# Patient Record
Sex: Female | Born: 2000 | Race: White | Hispanic: No | Marital: Single | Smoking: Never smoker
Health system: Southern US, Community
[De-identification: ages and names within clinical notes are randomized; demographics above are authoritative.]

## PROBLEM LIST (undated history)

## (undated) DIAGNOSIS — J310 Chronic rhinitis: Secondary | ICD-10-CM

## (undated) DIAGNOSIS — M549 Dorsalgia, unspecified: Secondary | ICD-10-CM

## (undated) DIAGNOSIS — E785 Hyperlipidemia, unspecified: Secondary | ICD-10-CM

## (undated) DIAGNOSIS — F32A Depression, unspecified: Secondary | ICD-10-CM

## (undated) DIAGNOSIS — N912 Amenorrhea, unspecified: Secondary | ICD-10-CM

## (undated) DIAGNOSIS — N809 Endometriosis, unspecified: Secondary | ICD-10-CM

## (undated) DIAGNOSIS — N946 Dysmenorrhea, unspecified: Secondary | ICD-10-CM

## (undated) DIAGNOSIS — Z872 Personal history of diseases of the skin and subcutaneous tissue: Secondary | ICD-10-CM

## (undated) HISTORY — DX: Amenorrhea, unspecified: N91.2

## (undated) HISTORY — DX: Personal history of diseases of the skin and subcutaneous tissue: Z87.2

## (undated) HISTORY — DX: Dorsalgia, unspecified: M54.9

## (undated) HISTORY — DX: Endometriosis, unspecified: N80.9

## (undated) HISTORY — PX: DENTAL SURGERY: SHX609

## (undated) HISTORY — DX: Hyperlipidemia, unspecified: E78.5

## (undated) HISTORY — DX: Chronic rhinitis: J31.0

## (undated) HISTORY — DX: Depression, unspecified: F32.A

## (undated) HISTORY — DX: Dysmenorrhea, unspecified: N94.6

---
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
## 2019-03-10 ENCOUNTER — Telehealth (INDEPENDENT_AMBULATORY_CARE_PROVIDER_SITE_OTHER): Payer: PRIVATE HEALTH INSURANCE | Admitting: Internal Medicine

## 2019-03-10 ENCOUNTER — Encounter: Payer: Self-pay | Admitting: Internal Medicine

## 2019-03-10 ENCOUNTER — Other Ambulatory Visit: Payer: Self-pay

## 2019-03-10 DIAGNOSIS — F321 Major depressive disorder, single episode, moderate: Secondary | ICD-10-CM

## 2019-03-10 MED ORDER — FLUOXETINE HCL 10 MG PO CAPS: 10.0000 mg | ORAL_CAPSULE | Freq: Every day | ORAL | 1 refills | Status: DC

## 2019-03-10 NOTE — Progress Notes (Signed)
Virtual Visit via Video Note  I connected with@ on 03/10/19 at  3:30 PM EST by a video enabled telemedicine application and verified that I am speaking with the correct person using two identifiers. Location patient: home Location provider:work  office Persons participating in the virtual visit: patient, provider  [place] national recommendations  regarding COVID 19 pandemic   video visit is advised over in office visit for this patient.  Patient aware  of the limitations of evaluation and management by telemedicine and  availability of in person appointments. and agreed to proceed.   HPI: Rose Stewart presents for video visit  In counseling since nov still struggling with depression and some anxiety    Looking to help with meds also  Student [employer] at home 18 hours [employer] psych and socw?  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  Past Medical History:  Diagnosis Date  . Back pain 2015   evaluated Dr Layne Benton RX: PT nsaids  . Hx of urticaria 2015   vs contact dermatitis   . Rhinitis 2004   evaluation allergy dr [name] felt non allergic    Past Surgical History:  Procedure Laterality Date  . DENTAL SURGERY      Family History  Problem Relation Age of Onset  . Endometriosis Mother   . Hypertension Mother   . Asthma Unknown   . Diabetes Mellitus II Unknown   . Alcohol abuse Unknown        sub  . Diabetes Maternal Grandfather     Social History   Tobacco Use  . Smoking status: Never Smoker  . Smokeless tobacco: Never Used  Substance Use Topics  . Alcohol use: No  . Drug use: No      Current Outpatient Medications:  .  cyclobenzaprine (FLEXERIL) 10 MG tablet, Take 1 tablet (10 mg total) by mouth 3 (three) times daily as needed for muscle spasms.,  Disp: 30 tablet, Rfl: 1 .  FLUoxetine (PROZAC) 10 MG capsule, Take 1 capsule (10 mg total) by mouth daily. Increase to 2 per day( 20 mg ) after 1 week, Disp: 60 capsule, Rfl: 1 .  ibuprofen (ADVIL,MOTRIN) 800 MG tablet, Take 1 tablet (800 mg total) by mouth every 8 (eight) hours as needed. For pain, Disp: 40 tablet, Rfl: 1 .  naproxen (NAPROSYN) 500 MG tablet, Take 1 tablet (500 mg total) by mouth 2 (two) times daily with a meal., Disp: 60 tablet, Rfl: 1 .  Norethindrone Acetate-Ethinyl Estrad-FE (LOESTRIN 24 FE) 1-20 MG-MCG(24) tablet, Take 1 tablet by mouth daily., Disp: 3 Package, Rfl: 4  EXAM: BP Readings from Last 3 Encounters:  03/17/18 116/62  01/27/18 (!) 142/90  08/14/17 112/70    VITALS per patient if applicable:  GENERAL: alert, oriented, appears well and in no acute distress  HEENT: atraumatic, conjunttiva clear, no obvious abnormalities on inspection of external nose and ears  NECK: normal movements of the head and neck  LUNGS: on inspection no signs of respiratory distress, breathing rate appears normal, no obvious gross SOB, gasping or wheezing  CV: no obvious cyanosis  MS: moves all visible extremities without noticeable abnormality  PSYCH/NEURO: pleasant and cooperative, nl  speech and  thought processing grossly intact phq 9  verydifficult    20  Lab Results  Component Value Date   WBC 10.9 10/20/2015   HGB 12.7 10/20/2015   HCT 37.6 10/20/2015   PLT 376 10/20/2015   GLUCOSE 97 10/20/2015   ALT 18 10/20/2015   AST 25 10/20/2015   NA 138 10/20/2015   K 3.7 10/20/2015   CL 106 10/20/2015   CREATININE 0.63 10/20/2015   BUN 12 10/20/2015   CO2 24 10/20/2015    ASSESSMENT AND PLAN:  Discussed the following assessment and plan:    ICD-10-CM   1. Depression, major, single episode, moderate (HCC)  F32.1    som anxiety  not predominant    With anxiety  functioning  No alarm sx  Predated covid 19 shut down  Some familu stresses  continue  Counseling and  add ssri  And fu  3 weeks    Begin fluoxetine 10 - 20  Counseled.   Expectant management and discussion of plan and treatment with opportunity to ask questions and all were answered. The patient agreed with the plan and demonstrated an understanding of the instructions.   Advised to call back or seek an in-person evaluation if worsening  or having  further concerns . In interm  Return in about 3 weeks (around 03/31/2019) for med check or as needed. I provided 35  minutes of non-face-to-face time during this encounter.   Berniece Andreas, MD

## 2019-04-01 NOTE — Progress Notes (Signed)
Virtual Visit via Video Note  I connected with@ on  3 18 21at 10:30 AM EDT by a video enabled telemedicine application and verified that I am speaking with the correct person using two identifiers. Location patient: home Location provider: home office Persons participating in the virtual visit: patient, provider  [place] national recommendations  regarding COVID 19 pandemic   video visit is advised over in office visit for this patient.  Patient aware  of the limitations of evaluation and management by telemedicine and  availability of in person appointments. and agreed to proceed.   HPI: Rose Stewart presents for video visit for fu of medication for depression anxiety sx   Began 10 mg for 2 weeks and then increase to 20 mg  For the past week  Has noted some improvement in not beeing down and mental energy not always difficult not as anxious  Sleep: still goes to be 10 - 11 but not falling asleep until about 2 am  Is drowsier in evening .  Seeing counselor monthly    ROS: See pertinent positives and negatives per HPI. No new sx reported   Past Medical History:  Diagnosis Date  . Back pain 2015   evaluated Dr Layne Benton RX: PT nsaids  . Hx of urticaria 2015   vs contact dermatitis   . Rhinitis 2004   evaluation allergy dr [name] felt non allergic    Past Surgical History:  Procedure Laterality Date  . DENTAL SURGERY      Family History  Problem Relation Age of Onset  . Endometriosis Mother   . Hypertension Mother   . Asthma Unknown   . Diabetes Mellitus II Unknown   . Alcohol abuse Unknown        sub  . Diabetes Maternal Grandfather     Social History   Tobacco Use  . Smoking status: Never Smoker  . Smokeless tobacco: Never Used  Substance Use Topics  . Alcohol use: No  . Drug use: No      Current Outpatient Medications:  .  FLUoxetine (PROZAC) 10 MG capsule, Take 1 capsule (10 mg total) by mouth daily. Increase to 2 per day( 20 mg ) after 1 week, Disp: 60  capsule, Rfl: 1 .  Norethindrone Acetate-Ethinyl Estrad-FE (LOESTRIN 24 FE) 1-20 MG-MCG(24) tablet, Take 1 tablet by mouth daily., Disp: 3 Package, Rfl: 4  EXAM: BP Readings from Last 3 Encounters:  03/17/18 116/62  01/27/18 (!) 142/90  08/14/17 112/70    VITALS per patient if applicable:  GENERAL: alert, oriented, appears well and in no acute distress  HEENT: atraumatic, conjunttiva clear, no obvious abnormalities on inspection of external nose and ears  NECK: normal movements of the head and neck  LUNGS: on inspection no signs of respiratory distress, breathing rate appears normal, no obvious gross SOB, gasping or wheezing mality  PSYCH/NEURO: pleasant and cooperative, affect a bit brighter  Articulate , speech and thought processing grossly intact   ASSESSMENT AND PLAN:  Discussed the following assessment and plan:    ICD-10-CM   1. Depression, major, single episode, moderate (HCC)  F32.1    improved on 20 gm still has sleep issues  2. Medication management  Z79.899   3. Sleep disturbance  G47.9     Counseled.  About sleep  Give time and sleep hygiene  Can try  fluox 10 bid  To see if makes a difference  But need more time on the 20 mg dosing.   Plan VV rov  in about 6 weeks  Have pharmacy contact us for refills  Continue monthly counseling  Or as indicated   Expectant management and discussion of plan and treatment with opportunity to ask questions and all were answered. The patient agreed with the plan and demonstrated an understanding of the instructions.   Advised to call back or seek an in-person evaluation if worsening  or having  further concerns . In interim  Return in about 6 weeks (around 05/14/2019) for medication virtual ok .   Berniece Andreas, MD

## 2019-04-02 ENCOUNTER — Encounter: Payer: Self-pay | Admitting: Internal Medicine

## 2019-04-02 ENCOUNTER — Other Ambulatory Visit: Payer: Self-pay | Admitting: Internal Medicine

## 2019-04-02 ENCOUNTER — Other Ambulatory Visit: Payer: Self-pay

## 2019-04-02 ENCOUNTER — Telehealth (INDEPENDENT_AMBULATORY_CARE_PROVIDER_SITE_OTHER): Payer: PRIVATE HEALTH INSURANCE | Admitting: Internal Medicine

## 2019-04-02 VITALS — Ht 63.25 in | Wt 150.0 lb

## 2019-04-02 DIAGNOSIS — F321 Major depressive disorder, single episode, moderate: Secondary | ICD-10-CM

## 2019-04-02 DIAGNOSIS — G479 Sleep disorder, unspecified: Secondary | ICD-10-CM | POA: Diagnosis not present

## 2019-04-02 DIAGNOSIS — Z79899 Other long term (current) drug therapy: Secondary | ICD-10-CM | POA: Diagnosis not present

## 2019-04-02 NOTE — Telephone Encounter (Signed)
Okay to refill for 90-day supply. 

## 2019-04-03 NOTE — Telephone Encounter (Signed)
Yes  Can but change to  10 mg  2 x per day for 90 days  No refills

## 2019-04-28 ENCOUNTER — Emergency Department (HOSPITAL_BASED_OUTPATIENT_CLINIC_OR_DEPARTMENT_OTHER)
Admission: EM | Admit: 2019-04-28 | Discharge: 2019-04-28 | Disposition: A | Discharge status: Home / Self Care | Payer: Commercial Managed Care - PPO | Attending: Emergency Medicine | Admitting: Emergency Medicine

## 2019-04-28 ENCOUNTER — Emergency Department (HOSPITAL_BASED_OUTPATIENT_CLINIC_OR_DEPARTMENT_OTHER): Payer: Commercial Managed Care - PPO

## 2019-04-28 ENCOUNTER — Other Ambulatory Visit: Payer: Self-pay

## 2019-04-28 ENCOUNTER — Encounter (HOSPITAL_BASED_OUTPATIENT_CLINIC_OR_DEPARTMENT_OTHER): Payer: Self-pay

## 2019-04-28 DIAGNOSIS — Y9351 Activity, roller skating (inline) and skateboarding: Secondary | ICD-10-CM | POA: Insufficient documentation

## 2019-04-28 DIAGNOSIS — Z793 Long term (current) use of hormonal contraceptives: Secondary | ICD-10-CM | POA: Insufficient documentation

## 2019-04-28 DIAGNOSIS — S52124A Nondisplaced fracture of head of right radius, initial encounter for closed fracture: Secondary | ICD-10-CM | POA: Insufficient documentation

## 2019-04-28 DIAGNOSIS — S93491A Sprain of other ligament of right ankle, initial encounter: Secondary | ICD-10-CM | POA: Insufficient documentation

## 2019-04-28 DIAGNOSIS — Y999 Unspecified external cause status: Secondary | ICD-10-CM | POA: Insufficient documentation

## 2019-04-28 DIAGNOSIS — Z79899 Other long term (current) drug therapy: Secondary | ICD-10-CM | POA: Insufficient documentation

## 2019-04-28 DIAGNOSIS — Y929 Unspecified place or not applicable: Secondary | ICD-10-CM | POA: Insufficient documentation

## 2019-04-28 DIAGNOSIS — S6991XA Unspecified injury of right wrist, hand and finger(s), initial encounter: Secondary | ICD-10-CM | POA: Diagnosis present

## 2019-04-28 MED ORDER — IBUPROFEN 800 MG PO TABS
800.0000 mg | ORAL_TABLET | Freq: Once | ORAL | Status: AC
  Administered 2019-04-28: 23:00:00 800 mg via ORAL
  Filled 2019-04-28: qty 1

## 2019-04-28 MED ORDER — HYDROCODONE-ACETAMINOPHEN 5-325 MG PO TABS
1.0000 | ORAL_TABLET | Freq: Once | ORAL | Status: AC
Start: 2019-04-28 — End: 2019-04-28
  Administered 2019-04-28: 23:00:00 1 via ORAL
  Filled 2019-04-28: qty 1

## 2019-04-28 MED ORDER — IBUPROFEN 800 MG PO TABS: 800.0000 mg | ORAL_TABLET | Freq: Three times a day (TID) | ORAL | 0 refills | Status: DC

## 2019-04-28 MED ORDER — ACETAMINOPHEN 500 MG PO TABS: 500.0000 mg | ORAL_TABLET | Freq: Four times a day (QID) | ORAL | 0 refills | Status: DC | PRN

## 2019-04-28 NOTE — ED Triage Notes (Addendum)
Pt states she fell off skate board ~45 min PTA-pain to right wrist, right elbow and right ankle-to triage in w/c-NAD

## 2019-04-28 NOTE — ED Provider Notes (Signed)
[facility] Provider Note   CSN: [id number] Arrival date & time: 04/28/19  1902     History Chief Complaint  Patient presents with  . Fall    Rose Stewart is a 19 y.o. female with no relevant past medical history presents the ED after sustaining mechanical fall while on a skateboard.  Patient reports that she was standing on it when it slipped out from underneath her and she landed on her right side.  She complains of 5 out of 10 right ankle pain, 7 out of 10 right wrist pain, and 9 out of 10 right elbow pain.  She has not been able to bear weight since sustaining her mechanical fall.  She is able to move her shoulder and can range her wrist, but is unable to flex or extend her elbow.  She is holding at her side at approximately 120 degrees.  She denies any significant swelling, numbness, weakness, tingling, clavicular discomfort, head injury, loss consciousness, bleeding disorder, open wound, or other symptoms.  HPI     Past Medical History:  Diagnosis Date  . Back pain 2015   evaluated Dr Layne Benton RX: PT nsaids  . Hx of urticaria 2015   vs contact dermatitis   . Rhinitis 2004   evaluation allergy dr [name] felt non allergic    Patient Active Problem List   Diagnosis Date Noted  . Recurrent headache 03/17/2018  . Family history of migraine headaches in mother 03/17/2018  . Oral contraceptive use 03/17/2018    Past Surgical History:  Procedure Laterality Date  . DENTAL SURGERY       OB History    Gravida  0   Para  0   Term  0   Preterm  0   AB  0   Living  0     SAB  0   TAB  0   Ectopic  0   Multiple  0   Live Births  0           Family History  Problem Relation Age of Onset  . Endometriosis Mother   . Hypertension Mother   . Asthma Other   . Diabetes Mellitus II Other   . Alcohol abuse Other        sub  . Diabetes Maternal Grandfather     Social History   Tobacco Use  . Smoking status: Never  Smoker  . Smokeless tobacco: Never Used  Substance Use Topics  . Alcohol use: No  . Drug use: No    Home Medications Prior to Admission medications   Medication Sig Start Date End Date Taking? Authorizing Provider  acetaminophen (TYLENOL) 500 MG tablet Take 1 tablet (500 mg total) by mouth every 6 (six) hours as needed. 04/28/19   Corena Herter, PA-C  FLUoxetine (PROZAC) 10 MG capsule Take 1 capsule (10 mg total) by mouth daily. BID 04/03/19   Panosh, Standley Brooking, MD  ibuprofen (ADVIL) 800 MG tablet Take 1 tablet (800 mg total) by mouth 3 (three) times daily. 04/28/19   Corena Herter, PA-C  Norethindrone Acetate-Ethinyl Estrad-FE (LOESTRIN 24 FE) 1-20 MG-MCG(24) tablet Take 1 tablet by mouth daily. 01/27/18   Princess Bruins, MD    Allergies    Patient has no known allergies.  Review of Systems   Review of Systems  Musculoskeletal: Positive for arthralgias and joint swelling.  Skin: Negative for wound.  Neurological: Negative for weakness and numbness.    Physical Exam Updated  Vital Signs BP 108/70 (BP Location: Right Arm)   Pulse 83   Temp 97.8 F (36.6 C) (Oral)   Resp 16   Ht 5\' 3"  (1.6 m)   Wt 68 kg   SpO2 98%   BMI 26.57 kg/m   Physical Exam Vitals and nursing note reviewed. Exam conducted with a chaperone present.  Constitutional:      General: She is not in acute distress.    Appearance: Normal appearance. She is not ill-appearing.  HENT:     Head: Normocephalic and atraumatic.  Eyes:     General: No scleral icterus.    Conjunctiva/sclera: Conjunctivae normal.  Cardiovascular:     Rate and Rhythm: Normal rate and regular rhythm.     Pulses: Normal pulses.     Heart sounds: Normal heart sounds.  Pulmonary:     Effort: Pulmonary effort is normal. No respiratory distress.     Breath sounds: Normal breath sounds.  Musculoskeletal:     Comments: Right shoulder: No tenderness to palpation.  No clavicular tenderness.  No overlying skin changes.  No swelling.   Can abduct shoulder against resistance.  ROM intact.  No humeral tenderness to palpation. Right elbow: Significant pain with any flexion or extension.  Tenderness over radial head.  She can flex and extend elbow to 90 degrees flexion and 150 degree extension, but limited due to discomfort.  No overlying skin changes.  No significant swelling on exam.  Soft compartments. Right wrist: No significant distal radial or distal ulnar TTP.  No scaphoid tenderness.  Radial pulse and cap refill intact.  Assessed radial, median, and ulnar nerve which are intact.  Can wiggle fingers.  Grip strength intact.  Can flex and extend wrist against resistance.  Skin:    General: Skin is dry.     Capillary Refill: Capillary refill takes less than 2 seconds.  Neurological:     Mental Status: She is alert.     GCS: GCS eye subscore is 4. GCS verbal subscore is 5. GCS motor subscore is 6.  Psychiatric:        Mood and Affect: Mood normal.        Behavior: Behavior normal.        Thought Content: Thought content normal.     ED Results / Procedures / Treatments   Labs (all labs ordered are listed, but only abnormal results are displayed) Labs Reviewed - No data to display  EKG None  Radiology DG Elbow Complete Right  Result Date: 04/28/2019 CLINICAL DATA:  Skateboard injury 45 minutes ago, right elbow pain EXAM: RIGHT ELBOW - COMPLETE 3+ VIEW COMPARISON:  None. FINDINGS: Frontal, bilateral oblique, and lateral views of the right elbow are obtained. There is a subtle cortical discontinuity along the lateral margin of the radial head on the external rotation view, consistent with nondisplaced fracture. No other acute bony abnormalities. Joint spaces are well preserved. Prominent elevation of the anterior fat pad on lateral view, without visualization of the posterior fat pad, equivocal for effusion. IMPRESSION: 1. Suspected nondisplaced fracture through the lateral margin of the radial head, with equivocal evidence  for associated joint effusion. Electronically Signed   By: 04/30/2019 M.D.   On: 04/28/2019 19:57   DG Wrist Complete Right  Result Date: 04/28/2019 CLINICAL DATA:  Fall today.  Wrist pain EXAM: RIGHT WRIST - COMPLETE 3+ VIEW COMPARISON:  None. FINDINGS: There is no evidence of fracture or dislocation. There is no evidence of arthropathy or other focal  bone abnormality. Soft tissues are unremarkable. IMPRESSION: Negative. Electronically Signed   By: Marlan Palau M.D.   On: 04/28/2019 19:56   DG Ankle Complete Right  Result Date: 04/28/2019 CLINICAL DATA:  Fall today. EXAM: RIGHT ANKLE - COMPLETE 3+ VIEW COMPARISON:  03/31/2018 FINDINGS: There is no evidence of fracture, dislocation, or joint effusion. There is no evidence of arthropathy or other focal bone abnormality. Soft tissues are unremarkable. IMPRESSION: Negative. Electronically Signed   By: Marlan Palau M.D.   On: 04/28/2019 19:58    Procedures Procedures (including critical care time)  Medications Ordered in ED Medications  HYDROcodone-acetaminophen (NORCO/VICODIN) 5-325 MG per tablet 1 tablet (has no administration in time range)  ibuprofen (ADVIL) tablet 800 mg (800 mg Oral Given 04/28/19 2245)    ED Course  I have reviewed the triage vital signs and the nursing notes.  Pertinent labs & imaging results that were available during my care of the patient were reviewed by me and considered in my medical decision making (see chart for details).    MDM Rules/Calculators/A&P                      I personally reviewed patient's imaging which demonstrates a nondisplaced fracture through the lateral margin of radial head, but no other acute osseous abnormalities.  This is consistent with her physical exam.  Patient reports that she has been evaluated at [facility] orthopedics in [place], [place] and has a good relationship with that practice.  Given her multiple complaints, feel as though orthopedic follow-up would be most  reasonable.  Will place in ASO brace for her right ankle swelling and discomfort consistent with knee sprain.  Will place in long-arm splint with posterior slab for her suspected radial head fracture.  Recommend that patient obtain a medical scooter from [employer] or similar distributor given her likely difficulty with crutches or cane in setting of her right ankle sprain.  Cautioned her about possibility of occult fractures that can be missed with initial imaging.    SPLINT APPLICATION Date/Time: 11:22 PM Authorized by: Evelena Leyden Consent: Verbal consent obtained. Risks and benefits: risks, benefits and alternatives were discussed Consent given by: patient Splint applied by: orthopedic technician Location details: Right arm Splint type: Long arm with posterior slab Supplies used: No plaster Post-procedure: The splinted body part was neurovascularly unchanged following the procedure. Patient tolerance: Patient tolerated the procedure well with no immediate complications.  Patient to be given ibuprofen and Tylenol for her symptoms of discomfort.  Cautioned on bleeding risk given her SSRI use.  Strict ED return precautions discussed.  All of the evaluation and work-up results were discussed with the patient and any family at bedside. They were provided opportunity to ask any additional questions and have none at this time. They have expressed understanding of verbal discharge instructions as well as return precautions and are agreeable to the plan.   Final Clinical Impression(s) / ED Diagnoses Final diagnoses:  Closed nondisplaced fracture of head of right radius, initial encounter  Sprain of anterior talofibular ligament of right ankle, initial encounter    Rx / DC Orders ED Discharge Orders         Ordered    ibuprofen (ADVIL) 800 MG tablet  3 times daily     04/28/19 2317    acetaminophen (TYLENOL) 500 MG tablet  Every 6 hours PRN     04/28/19 2317           Lorelee New,  PA-C 04/28/19 2323    Milagros Loll, MD 04/29/19 [phone]

## 2019-04-28 NOTE — Discharge Instructions (Addendum)
Please read the attachment on RICE therapy.  It is important that you obtain a scooter over-the-counter at a pharmacy or [employer] given your inability to use crutches.    Please take the Tylenol and ibuprofen, as prescribed.  Discontinue the ibuprofen immediately should you experience any epigastric pain or dark black stools given increased risk of bleeding while on Prozac.  Please follow-up with your primary care provider to notify them of today's encounter.  I would also like for you to schedule an appointment for ongoing evaluation and management with your orthopedist at [facility].    You were given narcotic and or sedative medications while in the emergency department. Do not drive. Do not use machinery or power tools. Do not sign legal documents. Do not drink alcohol. Do not take sleeping pills. Do not supervise children by yourself. Do not participate in activities that require climbing or being in high places.

## 2019-05-12 ENCOUNTER — Other Ambulatory Visit: Payer: Self-pay

## 2019-05-12 NOTE — Progress Notes (Signed)
This visit occurred during the SARS-CoV-2 public health emergency.  Safety protocols were in place, including screening questions prior to the visit, additional usage of staff PPE, and extensive cleaning of exam room while observing appropriate contact time as indicated for disinfecting solutions.    Chief Complaint  Patient presents with  . Menstrual Problem    Patient states she has not had a cycle in the past 2 months, she stopped her BC pills in October 2020    HPI: Rose Stewart 19 y.o. come in for   Period  Lack after stopping ocps   Had fx  r radius 4 13   Skateboard  Seeing Dr Rose Stewart   Seen for dpepressive sx  In march feb  Taking 20 fluoxetine  Doing better  Not as down all t he time  But still has decrease  Motivation and sleep an issues still not as sad   Not as bad  Went Off OCTS  since  Oct 20   Periods have been 5-6  Days monthly   Until last 2 mos  lmp Feb 27 - March 18 2019 and was normal   Menarche  6th grade .   Gets cramps with menses  Hx of depo shots   2019  X 2 years .  April  Second.had covid vaccine  9th  Moderna.   Family stresses   ROS: See pertinent positives and negatives per HPI. No vision change unusual ha breast dc  bno sa and denies a any risk of pregnancy   Past Medical History:  Diagnosis Date  . Back pain 2015   evaluated Dr Rose Stewart RX: PT nsaids  . Hx of urticaria 2015   vs contact dermatitis   . Rhinitis 2004   evaluation allergy dr [name] felt non allergic    Family History  Problem Relation Age of Onset  . Endometriosis Mother   . Hypertension Mother   . Asthma Other   . Diabetes Mellitus II Other   . Alcohol abuse Other        sub  . Diabetes Maternal Grandfather     Social History   Socioeconomic History  . Marital status: Single    Spouse name: Not on file  . Number of children: Not on file  . Years of education: Not on file  . Highest education level: Not on file  Occupational History  . Not on file  Tobacco  Use  . Smoking status: Never Smoker  . Smokeless tobacco: Never Used  Substance and Sexual Activity  . Alcohol use: No  . Drug use: No  . Sexual activity: Never    Birth control/protection: None  Other Topics Concern  . Not on file  Social History Narrative   HH  Of    Dog  Cats    9th grade  [employer].   Parents Rose Stewart and Rose Stewart good health self empoyed and Journalist, newspaper   Fa safety       Social Determinants of Health   Financial Resource Strain:   . Difficulty of Paying Living Expenses:   Food Insecurity:   . Worried About Charity fundraiser in the Last Year:   . Arboriculturist in the Last Year:   Transportation Needs:   . Film/video editor (Medical):   Marland Kitchen Lack of Transportation (Non-Medical):   Physical Activity:   . Days of Exercise per Week:   . Minutes of Exercise per Session:  Stress:   . Feeling of Stress :   Social Connections:   . Frequency of Communication with Friends and Family:   . Frequency of Social Gatherings with Friends and Family:   . Attends Religious Services:   . Active Member of Clubs or Organizations:   . Attends Banker Meetings:   Marland Kitchen Marital Status:     Outpatient Medications Prior to Visit  Medication Sig Dispense Refill  . acetaminophen (TYLENOL) 500 MG tablet Take 1 tablet (500 mg total) by mouth every 6 (six) hours as needed. 30 tablet 0  . FLUoxetine (PROZAC) 10 MG capsule Take 1 capsule (10 mg total) by mouth daily. BID 180 capsule 0  . ibuprofen (ADVIL) 800 MG tablet Take 1 tablet (800 mg total) by mouth 3 (three) times daily. 21 tablet 0  . ibuprofen (ADVIL) 400 MG tablet Take 400 mg by mouth 3 (three) times daily.    . Norethindrone Acetate-Ethinyl Estrad-FE (LOESTRIN 24 FE) 1-20 MG-MCG(24) tablet Take 1 tablet by mouth daily. (Patient not taking: Reported on 05/13/2019) 3 Package 4  . traMADol (ULTRAM) 50 MG tablet Take 50 mg by mouth 2 (two) times daily as needed.     No  facility-administered medications prior to visit.     EXAM:  BP 120/78   Pulse 84   Temp 97.6 F (36.4 C) (Temporal)   Ht 5\' 3"  (1.6 m)   Wt 150 lb 9.6 oz (68.3 kg)   SpO2 98%   BMI 26.68 kg/m   Body mass index is 26.68 kg/m.  GENERAL: vitals reviewed and listed above, alert, oriented, appears well hydrated and in no acute distress HEENT: atraumatic, conjunctiva  clear, no obvious abnormalities on inspection of external nose and ears OP : masked  NECK: no obvious masses on inspection palpation  LUNGS: clear to auscultation bilaterally, no wheezes, rales or rhonchi, good air movement CV: HRRR, no clubbing cyanosis or  peripheral edema nl cap refill  Abdomen:  Sof,t normal bowel sounds without hepatosplenomegaly, no guarding rebound or masses no CVA tenderness MS: r arm is splint left r ankle support  PSYCH: pleasant and cooperative, Lab Results  Component Value Date   WBC 10.9 10/20/2015   HGB 12.7 10/20/2015   HCT 37.6 10/20/2015   PLT 376 10/20/2015   GLUCOSE 97 10/20/2015   ALT 18 10/20/2015   AST 25 10/20/2015   NA 138 10/20/2015   K 3.7 10/20/2015   CL 106 10/20/2015   CREATININE 0.63 10/20/2015   BUN 12 10/20/2015   CO2 24 10/20/2015   BP Readings from Last 3 Encounters:  05/13/19 120/78  04/28/19 126/86  03/17/18 116/62  ate lunch   ASSESSMENT AND PLAN:  Discussed the following assessment and plan:  Lack of menses - could just be anovulatory  pause  nl exam lab today calendar and fu gyne check if needed - Plan: TSH, T4, free, Prolactin, HCG, Qualitative, CBC with Differential/Platelet, Basic metabolic panel, Hepatic function panel, Lipid panel  Screening, lipid - Plan: Lipid panel  Medication management  Depression, major, single episode, moderate (HCC) - improved follow and plan  touch base  in about a month and decide if visit needed at that time stay on same med  -Patient advised to return or notify health care team  if  new concerns  arise.  Patient Instructions  This could be a temporary problem  Blood work today   If no period in another month or so we  May get gyne  Involved .   checking cholesterol panel today because never done .   Calendar bleeding and let me know in a nother month .    Rose Stewart. Zuleica Seith M.D.

## 2019-05-13 ENCOUNTER — Encounter: Payer: Self-pay | Admitting: Internal Medicine

## 2019-05-13 ENCOUNTER — Ambulatory Visit (INDEPENDENT_AMBULATORY_CARE_PROVIDER_SITE_OTHER): Payer: Commercial Managed Care - PPO | Admitting: Internal Medicine

## 2019-05-13 ENCOUNTER — Other Ambulatory Visit: Payer: Self-pay

## 2019-05-13 VITALS — BP 120/78 | HR 84 | Temp 97.6°F | Ht 63.0 in | Wt 150.6 lb

## 2019-05-13 DIAGNOSIS — N912 Amenorrhea, unspecified: Secondary | ICD-10-CM

## 2019-05-13 DIAGNOSIS — Z13220 Encounter for screening for lipoid disorders: Secondary | ICD-10-CM | POA: Diagnosis not present

## 2019-05-13 DIAGNOSIS — Z79899 Other long term (current) drug therapy: Secondary | ICD-10-CM

## 2019-05-13 DIAGNOSIS — F321 Major depressive disorder, single episode, moderate: Secondary | ICD-10-CM | POA: Diagnosis not present

## 2019-05-13 LAB — PROLACTIN: Prolactin: 5.7 ng/mL

## 2019-05-13 LAB — HCG, SERUM, QUALITATIVE: Preg, Serum: NEGATIVE

## 2019-05-13 NOTE — Patient Instructions (Signed)
This could be a temporary problem  Blood work today   If no period in another month or so we  May get gyne   Involved .   checking cholesterol panel today because never done .   Calendar bleeding and let me know in a nother month .

## 2019-05-14 LAB — HEPATIC FUNCTION PANEL
ALT: 20 U/L (ref 0–35)
AST: 16 U/L (ref 0–37)
Albumin: 4.8 g/dL (ref 3.5–5.2)
Alkaline Phosphatase: 66 U/L (ref 39–117)
Bilirubin, Direct: 0.1 mg/dL (ref 0.0–0.3)
Total Bilirubin: 0.5 mg/dL (ref 0.2–1.2)
Total Protein: 7.4 g/dL (ref 6.0–8.3)

## 2019-05-14 LAB — CBC WITH DIFFERENTIAL/PLATELET
Basophils Absolute: 0 10*3/uL (ref 0.0–0.1)
Basophils Relative: 0.7 % (ref 0.0–3.0)
Eosinophils Absolute: 0.1 10*3/uL (ref 0.0–0.7)
Eosinophils Relative: 1.2 % (ref 0.0–5.0)
HCT: 40 % (ref 36.0–46.0)
Hemoglobin: 13.4 g/dL (ref 12.0–15.0)
Lymphocytes Relative: 36.7 % (ref 12.0–46.0)
Lymphs Abs: 2.4 10*3/uL (ref 0.7–4.0)
MCHC: 33.4 g/dL (ref 30.0–36.0)
MCV: 89.6 fl (ref 78.0–100.0)
Monocytes Absolute: 0.5 10*3/uL (ref 0.1–1.0)
Monocytes Relative: 7.8 % (ref 3.0–12.0)
Neutro Abs: 3.5 10*3/uL (ref 1.4–7.7)
Neutrophils Relative %: 53.6 % (ref 43.0–77.0)
Platelets: 434 10*3/uL — ABNORMAL HIGH (ref 150.0–400.0)
RBC: 4.46 Mil/uL (ref 3.87–5.11)
RDW: 13 % (ref 11.5–14.6)
WBC: 6.6 10*3/uL (ref 4.5–10.5)

## 2019-05-14 LAB — LIPID PANEL
Cholesterol: 272 mg/dL — ABNORMAL HIGH (ref 0–200)
HDL: 39.8 mg/dL (ref >39.00)
NonHDL: 232.08
Total CHOL/HDL Ratio: 7
Triglycerides: 220 mg/dL — ABNORMAL HIGH (ref 0.0–149.0)
VLDL: 44 mg/dL — ABNORMAL HIGH (ref 0.0–40.0)

## 2019-05-14 LAB — BASIC METABOLIC PANEL
BUN: 12 mg/dL (ref 6–23)
CO2: 29 mEq/L (ref 19–32)
Calcium: 9.6 mg/dL (ref 8.4–10.5)
Chloride: 100 mEq/L (ref 96–112)
Creatinine, Ser: 0.8 mg/dL (ref 0.40–1.20)
GFR: 91.26 mL/min (ref >60.00)
Glucose, Bld: 82 mg/dL (ref 70–99)
Potassium: 4.3 mEq/L (ref 3.5–5.1)
Sodium: 137 mEq/L (ref 135–145)

## 2019-05-14 LAB — LDL CHOLESTEROL, DIRECT: Direct LDL: 178 mg/dL

## 2019-05-14 LAB — T4, FREE: Free T4: 0.72 ng/dL (ref 0.60–1.60)

## 2019-05-14 LAB — TSH: TSH: 2.75 u[IU]/mL (ref 0.35–5.50)

## 2019-05-15 ENCOUNTER — Other Ambulatory Visit: Payer: Self-pay

## 2019-05-15 DIAGNOSIS — E7800 Pure hypercholesterolemia, unspecified: Secondary | ICD-10-CM

## 2019-05-15 NOTE — Progress Notes (Signed)
Blood work good except   cholesterol is very high   272  No explanation for missed periods  Please work on  healthy eating and activity to get this down  Avoiding /limit processed foods animal fats,fried  I,[place] fruit veges  fiber   http://[email or web address]/ is one web site that can help   Advise repeat fasting lipid panel in about  3-4 months  to see if  numbers have improved .

## 2019-05-15 NOTE — Telephone Encounter (Signed)
Thanks for the info . Will follow   See lab result note very high cholesterol

## 2019-06-25 ENCOUNTER — Other Ambulatory Visit: Payer: Self-pay

## 2019-06-25 ENCOUNTER — Encounter: Payer: Self-pay | Admitting: Obstetrics and Gynecology

## 2019-06-25 DIAGNOSIS — N911 Secondary amenorrhea: Secondary | ICD-10-CM

## 2019-06-25 NOTE — Telephone Encounter (Signed)
Please  Do referral to [facility]  About the  Secondary amenorrhea  .   Thanks

## 2019-06-26 ENCOUNTER — Other Ambulatory Visit: Payer: Self-pay | Admitting: Internal Medicine

## 2019-07-28 ENCOUNTER — Ambulatory Visit: Payer: Commercial Managed Care - PPO | Admitting: Obstetrics and Gynecology

## 2019-07-28 ENCOUNTER — Other Ambulatory Visit: Payer: Self-pay

## 2019-07-28 ENCOUNTER — Encounter: Payer: Self-pay | Admitting: Obstetrics and Gynecology

## 2019-07-28 VITALS — BP 110/70 | HR 84 | Temp 98.3°F | Ht 63.75 in | Wt 156.0 lb

## 2019-07-28 DIAGNOSIS — N911 Secondary amenorrhea: Secondary | ICD-10-CM

## 2019-07-28 DIAGNOSIS — Z8742 Personal history of other diseases of the female genital tract: Secondary | ICD-10-CM

## 2019-07-28 MED ORDER — MEDROXYPROGESTERONE ACETATE 5 MG PO TABS: ORAL_TABLET | ORAL | 0 refills | Status: DC

## 2019-07-28 NOTE — Progress Notes (Signed)
19 y.o. G0P0000 Single White or Caucasian Not Hispanic or Latino female here for a consultation from Dr Fabian Sharp for secondary amenorrhea. She has not had a period since February. She has not been taking the birth control and has never been sexually active.Patient's last menstrual period was 03/15/2019.          Records reviewed: In 4/21 lab work with a negative HCG, Prolactin 5.7, TSH 2.75.  Menarche age 59-12. Cycles were monthly x 7 days. Heavy for 7 days, changing her pad in 2-3 hours. Terrible cramps, primary dysmenorrhea. Never sexually active. She has seen 2 prior GYN's, was told she likely had endometriosis. She started on depo-provera at 70, switched to OCP's in 1/20. Thinks her last depo shot was 10-11/2017, felt she had mood changes. On OCP's her cycles were monthly and tolerable. She stopped her OCP's in 10/2018 secondary to mood changes. First cycle was in 12/20. Had a normal cycle in 1/21 and 2/21. Normal flow, not as heavy and crampy as prior to contraception. No cycle since February. No galactorrhea, no change in headaches (nothing severe), no major weight changes. She is under stress, but is doing better than she was. No vaginal dryness. No hirsutism. She has some depression, started prozac in 2/21, doing better.  She has been doing counseling. She did feel her depression improved off of the pill, then better again after starting prozac.    Sexually active: No.  The current method of family planning is none.    Exercising: No.  The patient does not participate in regular exercise at present. Smoker:  no  Health Maintenance: Pap:  Never  History of abnormal Pap:  Never  MMG:  Never  BMD:   Never  Colonoscopy: Never TDaP:  08/25/10 Gardasil: Completed 2    reports that she has never smoked. She has never used smokeless tobacco. She reports that she does not drink alcohol and does not use drugs. 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 Lives with her  Dad.   Past Medical History:  Diagnosis Date  . Amenorrhea   . Back pain 2015   evaluated Dr Cleophas Dunker RX: PT nsaids  . Depression   . Dysmenorrhea   . Endometriosis   . Hx of urticaria 2015   vs contact dermatitis   . Rhinitis 2004   evaluation allergy dr Stefan Church felt non allergic    Past Surgical History:  Procedure Laterality Date  . DENTAL SURGERY      Current Outpatient Medications  Medication Sig Dispense Refill  . FLUoxetine (PROZAC) 10 MG capsule TAKE 1 CAPSULE (10 MG TOTAL) BY MOUTH DAILY TWICE A DAY 180 capsule 0   No current facility-administered medications for this visit.    Family History  Problem Relation Age of Onset  . Endometriosis Mother   . Hypertension Mother   . Asthma Other   . Diabetes Mellitus II Other   . Alcohol abuse Other        sub  . Diabetes Maternal Grandfather     Review of Systems  Psychiatric/Behavioral: Positive for dysphoric mood.  All other systems reviewed and are negative. She c/o intermittent pain in BLQ with bending.   Exam:   BP 110/70   Pulse 84   Temp 98.3 F (36.8 C)   Ht 5' 3.75" (1.619 m)   Wt 156 lb (70.8 kg)   LMP 03/15/2019   SpO2 98%   BMI 26.99 kg/m   Weight change: @WEIGHTCHANGE @  Height:   Height: 5' 3.75" (161.9 cm)  Ht Readings from Last 3 Encounters:  07/28/19 5' 3.75" (1.619 m)  05/13/19 5\' 3"  (1.6 m)  04/28/19 5\' 3"  (1.6 m)    General appearance: alert, cooperative and appears stated age Head: Normocephalic, without obvious abnormality, atraumatic Neck: no adenopathy, supple, symmetrical, trachea midline and thyroid normal to inspection and palpation Lungs: clear to auscultation bilaterally Cardiovascular: regular rate and rhythm Abdomen: soft, non-tender; non distended,  no masses,  no organomegaly Extremities: extremities normal, atraumatic, no cyanosis or edema Skin: Skin color, texture, turgor normal. No rashes or lesions. No acne or hirsutism Lymph nodes: Cervical, supraclavicular, and  axillary nodes normal. No abnormal inguinal nodes palpated Neurologic: Grossly normal   A:  Secondary amenorrhea, normal TSH, normal prolactin, negative HCG. No hirsutism, no weight changes, some stress but improved  H/O severe dysmenorrhea  H/O heavy cycles   P:   FSH, estradiol  Provera 5 mg x 5 days. She should call with or without a bleed  Depending on above results will make further plans  CC: Dr 04/30/19 Note sent

## 2019-07-28 NOTE — Patient Instructions (Signed)

## 2019-07-29 LAB — FOLLICLE STIMULATING HORMONE: FSH: 6.5 m[IU]/mL

## 2019-07-29 LAB — ESTRADIOL: Estradiol: 252 pg/mL

## 2019-08-14 ENCOUNTER — Other Ambulatory Visit: Payer: Self-pay

## 2019-08-14 ENCOUNTER — Other Ambulatory Visit: Payer: Commercial Managed Care - PPO

## 2019-08-14 DIAGNOSIS — E7800 Pure hypercholesterolemia, unspecified: Secondary | ICD-10-CM

## 2019-08-14 LAB — LIPID PANEL
Cholesterol: 252 mg/dL — ABNORMAL HIGH (ref <200)
HDL: 41 mg/dL — ABNORMAL LOW (ref >=50)
LDL Cholesterol (Calc): 160 mg/dL (calc) — ABNORMAL HIGH
Non-HDL Cholesterol (Calc): 211 mg/dL (calc) — ABNORMAL HIGH (ref <130)
Total CHOL/HDL Ratio: 6.1 (calc) — ABNORMAL HIGH (ref <5.0)
Triglycerides: 331 mg/dL — ABNORMAL HIGH (ref <150)

## 2019-08-16 NOTE — Progress Notes (Signed)
Improved cholesterol panel but  still unfavorable  profile  . May we refer you to a dietician nutritionist ?  To get more help?   EIther way repeat lipid fasting in 6 months

## 2019-08-17 ENCOUNTER — Encounter: Payer: Self-pay | Admitting: Obstetrics and Gynecology

## 2019-08-17 ENCOUNTER — Telehealth: Payer: Self-pay | Admitting: Internal Medicine

## 2019-08-17 ENCOUNTER — Telehealth: Payer: Self-pay

## 2019-08-17 ENCOUNTER — Other Ambulatory Visit: Payer: Self-pay

## 2019-08-17 DIAGNOSIS — E7800 Pure hypercholesterolemia, unspecified: Secondary | ICD-10-CM

## 2019-08-17 NOTE — Telephone Encounter (Signed)
Rose Stewart, Krystian  P [facility] Hey Dr. Oscar La,   As a follow up to my last appointment, I was messaging you to notify my completion of taking the prescribed progesterone, finishing on July 18.   I did have a cycle between the days of July 27 to August 1. I did notice there was some heavier bleeding in the middle of my cycle between days 3 and 4, but nothing I would consider abnormal.   However, Im assuming an appointment would probably be our next step in figuring out what the concern is. Im more than glad to schedule at your earliest convenience.   Best,  [name]

## 2019-08-17 NOTE — Telephone Encounter (Signed)
Called patient back to discuss lab results. Please see lab results for message.

## 2019-08-17 NOTE — Telephone Encounter (Signed)
Pt return your call and want a call back. 

## 2019-08-17 NOTE — Telephone Encounter (Signed)
Patient was seen in office on 07/28/19, providing provera update.    Dr. Oscar La - please review and advise if OV needed at this time or ok to continue to monitor menses.

## 2019-08-18 NOTE — Telephone Encounter (Signed)
Spoke with patient, advised per Dr. Oscar La. Patient request OV to further discuss Mirena IUD. OV scheduled for 08/31/19 at 10am. Patient verbalizes understanding and is agreeable.   Encounter closed.

## 2019-08-18 NOTE — Telephone Encounter (Signed)
Left message to call Makita Blow, RN at [facility] [phone].   

## 2019-08-18 NOTE — Telephone Encounter (Signed)
Please let the patient know that her work up for missing her cycles has all been normal, including her lab work and the w/d bleed after taking provera. Something as simple as stress can cause a woman not to ovulate. Sometimes there isn't a clear explanation as to why a woman isn't ovulating regularly and ovulation can resume at any time. This absolutely doesn't mean she can't get pregnant.   The main thing we do in this situation is rule out problems (which we have) and prevent overgrowth of the lining of the uterus.    Her options are taking provera every other month for 5 days if she doesn't have a spontaneous cycle, try a different birth control (moody on prior OCP's), try the nuvaring, or get a mirena IUD.  See if she wants to set up an appointment to come in and discuss her options.

## 2019-08-20 ENCOUNTER — Encounter: Payer: Self-pay | Admitting: Skilled Nursing Facility1

## 2019-08-20 ENCOUNTER — Encounter: Payer: Commercial Managed Care - PPO | Attending: Internal Medicine | Admitting: Skilled Nursing Facility1

## 2019-08-20 ENCOUNTER — Other Ambulatory Visit: Payer: Self-pay

## 2019-08-20 DIAGNOSIS — E781 Pure hyperglyceridemia: Secondary | ICD-10-CM | POA: Insufficient documentation

## 2019-08-20 NOTE — Progress Notes (Signed)
Assessment:  Primary concerns today: relationship with food   Pt states if she knows her weight she will become obsessed with it stated it will not lead to unhealthy behaviors but she will be very emotionally uopset. Pt states her relationship with food SUCKS. Pt states she rather eat foods that make her happy than ones that are necessarily healthy. Pt states she does have depression. Pt states she has back pain from slouching. 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 Pt states she is a picky eater with textures. Food avoided: beans, strawberries, banana, egg, peas, cold cheese, cauliflower, raw tomatoes. Pt states she is okay with trying new things if it is in a small package due to fear/anxiety over food waste. Pt states she grew up food insecure: Dietitian advised to speak on this topic with her counselor to help her better understand and change her relationship with food.   Pt states she does talk therapy once to twice a month.  Pt states she is in bed 11pm up at 8am.  Pt states a couple times a week she eats an abundant amount of calories in one sitting.  Pt states her coworkers can be triggering due to them talking about their own size and diets they are on or breaking: Dietitian advised pt to discuss this with therapist to offer strategies to get through work lunches. Pt states she still enjoys having lunch with her coworkers.   Pt states her mother and sister are triggering and unsupportive.   Labs:  Triglycerides 331 Cholesterol 252 HDL 41  Body Composition Scale 08/20/2019  Current Body Weight 157.3  Total Body Fat % 30.5  Visceral Fat 5  Fat-Free Mass % 69.4   Total Body Water % 49.2  Muscle-Mass lbs 31.3  BMI 27.5  Body Fat Displacement          Torso  lbs 29.6         Left Leg  lbs 5.9         Right Leg  lbs 5.9         Left Arm  lbs 2.9         Right Arm   lbs 2.9      MEDICATIONS:  see list   DIETARY INTAKE:  Usual eating pattern includes 2 meals and 3  snacks per day.  Everyday foods include none stated.  Avoided foods include none stated.    24-hr recall: 90% of meals are eaten out  B ( AM):  Snk ( AM):  L ( 11-12 PM): [place] [employer] sub with meat and cheese and lettuce and tomato Snk ( PM): chips D ( PM): fast food Snk ( PM): candy Beverages: 64+ plain water, soda  Usual physical activity: ADL's    Intervention:  Nutrition counseling. Assisting the pt in beginning a change in her relationship with food in order to reduce her cholesterol and triglycerides.  Encouraged patient to reject traditional diet mentality of "good" vs "bad" foods.  There are no good and bad foods, but rather food is fuel that we need for our bodies.  When we don't get enough fuel, our bodies suffer the metabolic consequences.  Encouraged patient to eat whatever foods will satisfy them, regardless of their nutritional value.  We will discuss nutritional values of foods at a subsequent appointment.  Encouraged patient to honor their body's internal hunger and fullness cues.  Throughout the day, check in mentally and rate hunger.  Try not to eat when ravenous, but instead when slightly hungry.  Then choose food(s) that will be satisfying regardless of nutritional content.  Sit down to enjoy those foods.  Minimize distractions: turn off tv, put away tablet, work, phone,etc.  Make the meal last at least 20 minutes in order to give time to experience and register satiety.  Stop eating when full regardless of how much food is left on the plate.  Get more if still hungry 20 minutes later.  The key is to honor satisfaction so throughout the meal, rate fullness factor and stop when comfortably full, but not stuffed. The key is to honor hunger and fullness without any feelings of guilt.  Pay attention to what the internal cues are, rather than any external factors.   Goals: Try blueberries  Get some  edamame  Try saurkraut  Eat with 1-1.5 hours of waking Use should I eat sheet and mindful meals to help you stay in the moment with your foods continue to reframe negative thoughts to positive  Try a youtube video for yoga or purre barr in your room alone   Teaching Method Utilized:  Visual Auditory Hands on  Handouts given during visit include:  Mindful meals  Should I eat sheet  Barriers to learning/adherence to lifestyle change: anxiety  Demonstrated degree of understanding via:  Teach Back   Monitoring/Evaluation:  Dietary intake, exercise, and body weight prn.

## 2019-08-26 NOTE — Progress Notes (Signed)
GYNECOLOGY  VISIT   HPI: 19 y.o.   Single White or Caucasian Not Hispanic or Latino  female   G0P0000 with No LMP recorded.   here for  iud consult. Patient is not sexually active yet.  She has a h/o secondary oligomenorrhea with a negative w/u and a w/d bleed with provera. She bleed for 6 days, heavy x 3 days, saturated a pad in up to 2 hours. Cramps were bad.  She has a h/o severe dysmenorrhea. She has previously been treated with depo-provera (last shot in 2019) and OCP's, mood changes on both. Mood improved off of OCP's  She started prozac in 2/21, doing well.   GYNECOLOGIC HISTORY: No LMP recorded. Contraception:none  Menopausal hormone therapy: none         OB History    Gravida  0   Para  0   Term  0   Preterm  0   AB  0   Living  0     SAB  0   TAB  0   Ectopic  0   Multiple  0   Live Births  0              Patient Active Problem List   Diagnosis Date Noted   Recurrent headache 03/17/2018   Family history of migraine headaches in mother 03/17/2018   Oral contraceptive use 03/17/2018    Past Medical History:  Diagnosis Date   Amenorrhea    Back pain 2015   evaluated Dr Cleophas Dunker RX: PT nsaids   Depression    Dysmenorrhea    Endometriosis    Hx of urticaria 2015   vs contact dermatitis    Hyperlipidemia    Rhinitis 2004   evaluation allergy dr Stefan Church felt non allergic    Past Surgical History:  Procedure Laterality Date   DENTAL SURGERY      Current Outpatient Medications  Medication Sig Dispense Refill   FLUoxetine (PROZAC) 10 MG capsule TAKE 1 CAPSULE (10 MG TOTAL) BY MOUTH DAILY TWICE A DAY 180 capsule 0   medroxyPROGESTERone (PROVERA) 5 MG tablet Take one tablet a day for 5 days 5 tablet 0   No current facility-administered medications for this visit.     ALLERGIES: Patient has no known allergies.  Family History  Problem Relation Age of Onset   Endometriosis Mother    Hypertension Mother    Asthma Other     Diabetes Mellitus II Other    Alcohol abuse Other        sub   Diabetes Maternal Grandfather     Social History   Socioeconomic History   Marital status: Single    Spouse name: Not on file   Number of children: Not on file   Years of education: Not on file   Highest education level: Not on file  Occupational History   Not on file  Tobacco Use   Smoking status: Never Smoker   Smokeless tobacco: Never Used  Vaping Use   Vaping Use: Never used  Substance and Sexual Activity   Alcohol use: No   Drug use: No   Sexual activity: Never    Birth control/protection: None  Other Topics Concern   Not on file  Social History Narrative   HH  Of    Dog  Cats    9th grade  [employer].   Parents Linton Rump and Luster Landsberg good health self empoyed and Dentist  Social Determinants of Health   Financial Resource Strain:    Difficulty of Paying Living Expenses:   Food Insecurity:    Worried About Programme researcher, broadcasting/film/video in the Last Year:    Barista in the Last Year:   Transportation Needs:    Freight forwarder (Medical):    Lack of Transportation (Non-Medical):   Physical Activity:    Days of Exercise per Week:    Minutes of Exercise per Session:   Stress:    Feeling of Stress :   Social Connections:    Frequency of Communication with Friends and Family:    Frequency of Social Gatherings with Friends and Family:    Attends Religious Services:    Active Member of Clubs or Organizations:    Attends Engineer, structural:    Marital Status:   Intimate Partner Violence:    Fear of Current or Ex-Partner:    Emotionally Abused:    Physically Abused:    Sexually Abused:     Review of Systems  All other systems reviewed and are negative.   PHYSICAL EXAMINATION:    There were no vitals taken for this visit.    General appearance: alert, cooperative and appears stated  age  ASSESSMENT H/O oligomenorrhea, negative w/u, w/d to provera H/O severe dysmenorrhea Contraception   PLAN Discussed options of cyclic provera (not contraception), OCP's, depo-provera and mirena IUD Discussed risks and side effects with the mirena She would like the mirena, will return for insertion

## 2019-08-31 ENCOUNTER — Ambulatory Visit: Payer: Commercial Managed Care - PPO | Admitting: Obstetrics and Gynecology

## 2019-08-31 ENCOUNTER — Telehealth: Payer: Self-pay | Admitting: Obstetrics and Gynecology

## 2019-08-31 ENCOUNTER — Other Ambulatory Visit: Payer: Self-pay

## 2019-08-31 ENCOUNTER — Encounter: Payer: Self-pay | Admitting: Obstetrics and Gynecology

## 2019-08-31 VITALS — BP 104/64 | HR 92 | Ht 63.0 in | Wt 161.0 lb

## 2019-08-31 DIAGNOSIS — N914 Secondary oligomenorrhea: Secondary | ICD-10-CM

## 2019-08-31 DIAGNOSIS — Z3009 Encounter for other general counseling and advice on contraception: Secondary | ICD-10-CM

## 2019-08-31 DIAGNOSIS — N946 Dysmenorrhea, unspecified: Secondary | ICD-10-CM | POA: Diagnosis not present

## 2019-08-31 NOTE — Telephone Encounter (Signed)
Call to patient. Per DPR, OK to leave message on voicemail.   Left voicemail requesting a return call to [facility] to review benefits and schedule recommended IUD Insertion with Gertie Exon, MD

## 2019-09-03 ENCOUNTER — Telehealth: Payer: Self-pay | Admitting: Obstetrics and Gynecology

## 2019-09-03 NOTE — Telephone Encounter (Signed)
Call placed to convey benefits. Spoke with the patient and conveyed the benefits. Patient understands/agreeable with the benefits. Patient to call at onset of cycle for scheduling.

## 2019-09-09 NOTE — Telephone Encounter (Signed)
Spoke with patient regarding benefits for recommended IUD insertion. Patient acknowledges understanding of information presented. Patient is aware of cancellation policy. Patient scheduled appointment for 09/15/2019 at 1130AM with Gertie Exon, MD. Encounter closed.

## 2019-09-15 ENCOUNTER — Other Ambulatory Visit: Payer: Self-pay

## 2019-09-15 ENCOUNTER — Ambulatory Visit (INDEPENDENT_AMBULATORY_CARE_PROVIDER_SITE_OTHER): Payer: Commercial Managed Care - PPO | Admitting: Obstetrics and Gynecology

## 2019-09-15 ENCOUNTER — Encounter: Payer: Self-pay | Admitting: Obstetrics and Gynecology

## 2019-09-15 VITALS — BP 112/68 | HR 91 | Ht 63.0 in | Wt 162.0 lb

## 2019-09-15 DIAGNOSIS — Z30430 Encounter for insertion of intrauterine contraceptive device: Secondary | ICD-10-CM

## 2019-09-15 DIAGNOSIS — Z3009 Encounter for other general counseling and advice on contraception: Secondary | ICD-10-CM

## 2019-09-15 NOTE — Patient Instructions (Signed)

## 2019-09-15 NOTE — Progress Notes (Signed)
GYNECOLOGY  VISIT   HPI: 19 y.o.   Single White or Caucasian Not Hispanic or Latino  female   G0P0000 with Patient's last menstrual period was 08/11/2019.   here for IUD insertion Patient is not sexually active yet.   GYNECOLOGIC HISTORY: Patient's last menstrual period was 08/11/2019. Contraception:none  Menopausal hormone therapy: none        OB History    Gravida  0   Para  0   Term  0   Preterm  0   AB  0   Living  0     SAB  0   TAB  0   Ectopic  0   Multiple  0   Live Births  0              Patient Active Problem List   Diagnosis Date Noted  . Recurrent headache 03/17/2018  . Family history of migraine headaches in mother 03/17/2018  . Oral contraceptive use 03/17/2018    Past Medical History:  Diagnosis Date  . Amenorrhea   . Back pain 2015   evaluated Dr Cleophas Dunker RX: PT nsaids  . Depression   . Dysmenorrhea   . Endometriosis   . Hx of urticaria 2015   vs contact dermatitis   . Hyperlipidemia   . Rhinitis 2004   evaluation allergy dr Stefan Church felt non allergic    Past Surgical History:  Procedure Laterality Date  . DENTAL SURGERY      Current Outpatient Medications  Medication Sig Dispense Refill  . atomoxetine (STRATTERA) 25 MG capsule Take 25 mg by mouth every morning.    Marland Kitchen FLUoxetine (PROZAC) 10 MG capsule TAKE 1 CAPSULE (10 MG TOTAL) BY MOUTH DAILY TWICE A DAY 180 capsule 0   No current facility-administered medications for this visit.     ALLERGIES: Patient has no known allergies.  Family History  Problem Relation Age of Onset  . Endometriosis Mother   . Hypertension Mother   . Asthma Other   . Diabetes Mellitus II Other   . Alcohol abuse Other        sub  . Diabetes Maternal Grandfather     Social History   Socioeconomic History  . Marital status: Single    Spouse name: Not on file  . Number of children: Not on file  . Years of education: Not on file  . Highest education level: Not on file  Occupational  History  . Not on file  Tobacco Use  . Smoking status: Never Smoker  . Smokeless tobacco: Never Used  Vaping Use  . Vaping Use: Never used  Substance and Sexual Activity  . Alcohol use: No  . Drug use: No  . Sexual activity: Never    Birth control/protection: None  Other Topics Concern  . Not on file  Social History Narrative   HH  Of    Dog  Cats    9th grade  [employer].   Parents Linton Rump and Luster Landsberg good health self empoyed and Cabin crew   Fa safety       Social Determinants of Health   Financial Resource Strain:   . Difficulty of Paying Living Expenses: Not on file  Food Insecurity:   . Worried About Programme researcher, broadcasting/film/video in the Last Year: Not on file  . Ran Out of Food in the Last Year: Not on file  Transportation Needs:   . Lack of Transportation (Medical): Not on file  .  Lack of Transportation (Non-Medical): Not on file  Physical Activity:   . Days of Exercise per Week: Not on file  . Minutes of Exercise per Session: Not on file  Stress:   . Feeling of Stress : Not on file  Social Connections:   . Frequency of Communication with Friends and Family: Not on file  . Frequency of Social Gatherings with Friends and Family: Not on file  . Attends Religious Services: Not on file  . Active Member of Clubs or Organizations: Not on file  . Attends Banker Meetings: Not on file  . Marital Status: Not on file  Intimate Partner Violence:   . Fear of Current or Ex-Partner: Not on file  . Emotionally Abused: Not on file  . Physically Abused: Not on file  . Sexually Abused: Not on file    Review of Systems  All other systems reviewed and are negative.   PHYSICAL EXAMINATION:    BP 112/68   Pulse 91   Ht 5\' 3"  (1.6 m)   Wt 162 lb (73.5 kg)   LMP 08/11/2019   SpO2 98%   BMI 28.70 kg/m     General appearance: alert, cooperative and appears stated age  Pelvic: External genitalia:  no lesions              Urethra:   normal appearing urethra with no masses, tenderness or lesions              Bartholins and Skenes: normal                 Vagina: normal appearing vagina with normal color and discharge, no lesions              Cervix: no lesions              Bimanual Exam:  Uterus:  normal size, contour, position, consistency, mobility, non-tender and anteverted              Adnexa: no mass, fullness, tenderness               The risks of the mirena IUD were reviewed with the patient, including infection, abnormal bleeding and uterine perfortion. Consent was signed.  A speculum was placed in the vagina, the cervix was cleansed with betadine. A tenaculum was placed on the cervix, the uterus sounded to 7-8 cm. The cervix was dilated to a 5 hagar dilator  The mirena IUD was inserted without difficulty. The string were cut to 3-4 cm. The tenaculum was removed. Slight oozing from the tenaculum site was stopped with pressure.   The patient tolerated the procedure well.    Chaperone, 08/13/2019, was present for exam.  ASSESSMENT Mirena IUD insertion    PLAN F/U in one month Use condoms if sexually active for STD protection.   CC: Dr Carolynn Serve

## 2019-09-21 ENCOUNTER — Encounter: Payer: Self-pay | Admitting: Obstetrics and Gynecology

## 2019-09-22 ENCOUNTER — Telehealth: Payer: Self-pay

## 2019-09-22 ENCOUNTER — Other Ambulatory Visit: Payer: Self-pay | Admitting: Internal Medicine

## 2019-09-22 NOTE — Telephone Encounter (Signed)
Spoke with pt. Pt states having Mirena  IUD inserted on 09/15/19 and still continues to have intermittent cramps that she rates as 5. Has taken OTC Aleve, heating pad, yoga and stretching and nothing resolves the cramps completley. Pt states is having light spotting that she is changing a panty liner every 4 hours for hygenic purposes, but not soaking. Pt denies heavy vaginal bleeding or clots. Pt states has not have SA or string check since insertion.   Pt advised to have OV for further evaluation. Pt agreeable. Pt scheduled for OV with Dr Oscar La 9/8 at 1030 am. Pt agreeable and verbalized understanding to date and time of appt. Advised having irregular cycle is normal after IUD insertion. Pt to continuee to monitor sx until OV. Pt agreeable.  Encounter closed.

## 2019-09-22 NOTE — Telephone Encounter (Signed)
Pt sent following mychart message:   Rose Stewart, Rose [facility] Clinical Pool Hi Dr. Oscar La,   Ive noticed since my appointment last Tuesday to get an IUD inserted that the cramping has not gone away completely. I did what was recommended after my appointment (use a heating pad, take Advil, etc), but that has not helped alleviate the pain Im experiencing 100%. What would be the next step to take regarding this concern?   Best,  Aline Brochure

## 2019-09-23 ENCOUNTER — Other Ambulatory Visit: Payer: Self-pay

## 2019-09-23 ENCOUNTER — Telehealth: Payer: Self-pay | Admitting: Obstetrics and Gynecology

## 2019-09-23 ENCOUNTER — Ambulatory Visit: Payer: Commercial Managed Care - PPO | Admitting: Obstetrics and Gynecology

## 2019-09-23 ENCOUNTER — Encounter: Payer: Self-pay | Admitting: Obstetrics and Gynecology

## 2019-09-23 ENCOUNTER — Encounter: Payer: Commercial Managed Care - PPO | Attending: Internal Medicine | Admitting: Skilled Nursing Facility1

## 2019-09-23 VITALS — BP 122/68 | HR 119 | Ht 63.0 in | Wt 161.0 lb

## 2019-09-23 DIAGNOSIS — R102 Pelvic and perineal pain: Secondary | ICD-10-CM | POA: Diagnosis not present

## 2019-09-23 DIAGNOSIS — E781 Pure hyperglyceridemia: Secondary | ICD-10-CM | POA: Diagnosis not present

## 2019-09-23 DIAGNOSIS — E7800 Pure hypercholesterolemia, unspecified: Secondary | ICD-10-CM

## 2019-09-23 MED ORDER — IBUPROFEN 800 MG PO TABS: 800.0000 mg | ORAL_TABLET | Freq: Three times a day (TID) | ORAL | 1 refills | Status: DC | PRN

## 2019-09-23 NOTE — Progress Notes (Signed)
GYNECOLOGY  VISIT   HPI: 19 y.o.   Single White or Caucasian Not Hispanic or Latino  female   G0P0000 with No LMP recorded.   here for is having cramping and light bleeding she had an IUD placed 09/15/19. Patient is not sexually active.  She had a mirena IUD placed 8 days ago. Her cramping has been intermittent since she left the office, up to a 5-6/10 in severity. She has taken ibuprofen, takes the edge off, doesn't take it away. She is just spotting.   GYNECOLOGIC HISTORY: No LMP recorded. Contraception:IUD Menopausal hormone therapy: none         OB History    Gravida  0   Para  0   Term  0   Preterm  0   AB  0   Living  0     SAB  0   TAB  0   Ectopic  0   Multiple  0   Live Births  0              Patient Active Problem List   Diagnosis Date Noted  . Recurrent headache 03/17/2018  . Family history of migraine headaches in mother 03/17/2018  . Oral contraceptive use 03/17/2018    Past Medical History:  Diagnosis Date  . Amenorrhea   . Back pain 2015   evaluated Dr Cleophas Dunker RX: PT nsaids  . Depression   . Dysmenorrhea   . Endometriosis   . Hx of urticaria 2015   vs contact dermatitis   . Hyperlipidemia   . Rhinitis 2004   evaluation allergy dr Stefan Church felt non allergic    Past Surgical History:  Procedure Laterality Date  . DENTAL SURGERY      Current Outpatient Medications  Medication Sig Dispense Refill  . atomoxetine (STRATTERA) 25 MG capsule Take 25 mg by mouth every morning.    Marland Kitchen FLUoxetine (PROZAC) 10 MG capsule TAKE 1 CAPSULE (10 MG TOTAL) BY MOUTH DAILY TWICE A DAY 180 capsule 0   No current facility-administered medications for this visit.     ALLERGIES: Patient has no known allergies.  Family History  Problem Relation Age of Onset  . Endometriosis Mother   . Hypertension Mother   . Asthma Other   . Diabetes Mellitus II Other   . Alcohol abuse Other        sub  . Diabetes Maternal Grandfather     Social History    Socioeconomic History  . Marital status: Single    Spouse name: Not on file  . Number of children: Not on file  . Years of education: Not on file  . Highest education level: Not on file  Occupational History  . Not on file  Tobacco Use  . Smoking status: Never Smoker  . Smokeless tobacco: Never Used  Vaping Use  . Vaping Use: Never used  Substance and Sexual Activity  . Alcohol use: No  . Drug use: No  . Sexual activity: Never    Birth control/protection: None  Other Topics Concern  . Not on file  Social History Narrative   HH  Of    Dog  Cats    9th grade  [employer].   Parents Linton Rump and Luster Landsberg good health self empoyed and Cabin crew   Fa safety       Social Determinants of Health   Financial Resource Strain:   . Difficulty of Paying Living Expenses: Not on file  Food  Insecurity:   . Worried About Programme researcher, broadcasting/film/video in the Last Year: Not on file  . Ran Out of Food in the Last Year: Not on file  Transportation Needs:   . Lack of Transportation (Medical): Not on file  . Lack of Transportation (Non-Medical): Not on file  Physical Activity:   . Days of Exercise per Week: Not on file  . Minutes of Exercise per Session: Not on file  Stress:   . Feeling of Stress : Not on file  Social Connections:   . Frequency of Communication with Friends and Family: Not on file  . Frequency of Social Gatherings with Friends and Family: Not on file  . Attends Religious Services: Not on file  . Active Member of Clubs or Organizations: Not on file  . Attends Banker Meetings: Not on file  . Marital Status: Not on file  Intimate Partner Violence:   . Fear of Current or Ex-Partner: Not on file  . Emotionally Abused: Not on file  . Physically Abused: Not on file  . Sexually Abused: Not on file    Review of Systems  Gastrointestinal: Positive for abdominal pain.  All other systems reviewed and are negative.   PHYSICAL  EXAMINATION:    BP 122/68   Pulse (!) 119   Ht 5\' 3"  (1.6 m)   Wt 161 lb (73 kg)   SpO2 97%   BMI 28.52 kg/m     General appearance: alert, cooperative and appears stated age Abdomen: soft, mildly tender in the suprapubic region. No rebound, no guarding. Non distended, no masses,  no organomegaly  Pelvic: External genitalia:  no lesions              Urethra:  normal appearing urethra with no masses, tenderness or lesions              Bartholins and Skenes: normal                 Vagina: normal appearing vagina with normal color and discharge, no lesions, small amount of brown blood in the vagina.               Cervix: no cervical motion tenderness, no lesions and IUD strings 2 cm              Bimanual Exam:  Uterus:  anteverted, mobile, normal sized, tender              Adnexa: no mass, fullness, tenderness              Chaperone was present for exam.  ASSESSMENT 8 days s/p mirena IUD insertion, having ongoing cramps that aren't controlled with OTC ibuprofen. Uterus is tender on exam. Not sexually active yet so no risk of STD.    PLAN Return for pelvic ultrasound Script for ibuprofen 800 mg tablets sent to the pharmacy

## 2019-09-23 NOTE — Telephone Encounter (Signed)
Call placed to convey benefits for scheduled ultrasound for 09/24/19.

## 2019-09-23 NOTE — Progress Notes (Signed)
Assessment:  Primary concerns today: relationship with food   Pt state she moved into an apartment and found the handouts useful. Pt states she has started a new ADD medicine which she finds helpful. Pt states she tried blueberries went okay but they reminder her of grapes. Pt states she was eating breakfast but then with moving out fell out of the habit. Pt states she got some new foods to try recently too. Pt states she has been making more meals at home. Pt states she has more conscious of having food at home due to cafeteria being closed before she gets out of class. Pt states she avoids cooking meat because she cannot stand the smell. Pt states her friend did not allow her to have a scale at the house. Pt statess her friend also stands in front of the mirror to not allow her to pick at herself.   Labs:  Triglycerides 331 Cholesterol 252 HDL 41  Body Composition Scale 08/20/2019 09/23/2019  Current Body Weight 157.3 157.8  Total Body Fat % 30.5 30.7  Visceral Fat 5 5  Fat-Free Mass % 69.4 69.2   Total Body Water % 49.2 49.1  Muscle-Mass lbs 31.3 31.3  BMI 27.5 27.6  Body Fat Displacement           Torso  lbs 29.6 29.9         Left Leg  lbs 5.9 5.9         Right Leg  lbs 5.9 5.9         Left Arm  lbs 2.9 2.9         Right Arm   lbs 2.9 2.9   8-1 work, 2-3:15 class   MEDICATIONS: see list   DIETARY INTAKE:  Usual eating pattern includes 2 meals and 3  snacks per day.  Everyday foods include none stated.  Avoided foods include none stated.    24-hr recall: 90% of meals are eaten out  B ( AM):  Snk ( AM):  L ( 12-1 PM): [place] [employer] sub with meat and cheese and lettuce and tomato Snk ( PM): chips D ( PM): pizza or spaghetti or eggs Snk ( PM): candy Beverages: 64+ plain water, soda  Usual physical activity: ADL's    Intervention:  Nutrition counseling. Assisting the pt in beginning a change in her relationship with food in order to reduce her cholesterol and  triglycerides.  Encouraged patient to reject traditional diet mentality of "good" vs "bad" foods.  There are no good and bad foods, but rather food is fuel that we need for our bodies.  When we don't get enough fuel, our bodies suffer the metabolic consequences.  Encouraged patient to eat whatever foods will satisfy them, regardless of their nutritional value.  We will discuss nutritional values of foods at a subsequent appointment.  Encouraged patient to honor their body's internal hunger and fullness cues.  Throughout the day, check in mentally and rate hunger.  Try not to eat when ravenous, but instead when slightly hungry.  Then choose food(s) that will be satisfying regardless of nutritional content.  Sit down to enjoy those foods.  Minimize distractions: turn off tv, put away tablet, work, phone,etc.  Make the meal last at least 20 minutes in order to give time to experience and register satiety.  Stop eating when full regardless of how much food is left on the plate.  Get more if still hungry 20 minutes later.  The key is to honor  satisfaction so throughout the meal, rate fullness factor and stop when comfortably full, but not stuffed. The key is to honor hunger and fullness without any feelings of guilt.  Pay attention to what the internal cues are, rather than any external factors.   Goals:   Try some of the meatless brands    Teaching Method Utilized:  Visual Auditory Hands on  Handouts given during visit include:  Mindful meals  Should I eat sheet  Barriers to learning/adherence to lifestyle change: anxiety  Demonstrated degree of understanding via:  Teach Back   Monitoring/Evaluation:  Dietary intake, exercise, and body weight prn.

## 2019-09-24 ENCOUNTER — Encounter (INDEPENDENT_AMBULATORY_CARE_PROVIDER_SITE_OTHER): Payer: Self-pay

## 2019-09-24 ENCOUNTER — Encounter: Payer: Self-pay | Admitting: Obstetrics and Gynecology

## 2019-09-24 ENCOUNTER — Ambulatory Visit: Payer: Commercial Managed Care - PPO | Admitting: Obstetrics and Gynecology

## 2019-09-24 ENCOUNTER — Ambulatory Visit (INDEPENDENT_AMBULATORY_CARE_PROVIDER_SITE_OTHER): Payer: Commercial Managed Care - PPO

## 2019-09-24 VITALS — BP 110/60 | HR 89 | Ht 63.0 in | Wt 162.0 lb

## 2019-09-24 DIAGNOSIS — R102 Pelvic and perineal pain: Secondary | ICD-10-CM

## 2019-09-24 DIAGNOSIS — Z30431 Encounter for routine checking of intrauterine contraceptive device: Secondary | ICD-10-CM

## 2019-09-24 NOTE — Progress Notes (Signed)
GYNECOLOGY  VISIT   HPI: 19 y.o.   Single White or Caucasian Not Hispanic or Latino  female   [id number] with No LMP recorded.   here for ultrasound for pelvic pain and to check IUD location. She had a mirena IUD inserted on 09/15/19, she was seen yesterday with c/o continued moderate pelvic cramping.   GYNECOLOGIC HISTORY: No LMP recorded. Contraception:IUD Menopausal hormone therapy: none         OB History    Gravida  0   Para  0   Term  0   Preterm  0   AB  0   Living  0     SAB  0   TAB  0   Ectopic  0   Multiple  0   Live Births  0              Patient Active Problem List   Diagnosis Date Noted  . Recurrent headache 03/17/2018  . Family history of migraine headaches in mother 03/17/2018  . Oral contraceptive use 03/17/2018    Past Medical History:  Diagnosis Date  . Amenorrhea   . Back pain 2015   evaluated Dr Cleophas Dunker RX: PT nsaids  . Depression   . Dysmenorrhea   . Endometriosis   . Hx of urticaria 2015   vs contact dermatitis   . Hyperlipidemia   . Rhinitis 2004   evaluation allergy dr Stefan Church felt non allergic    Past Surgical History:  Procedure Laterality Date  . DENTAL SURGERY      Current Outpatient Medications  Medication Sig Dispense Refill  . atomoxetine (STRATTERA) 25 MG capsule Take 25 mg by mouth every morning.    Marland Kitchen FLUoxetine (PROZAC) 10 MG capsule TAKE 1 CAPSULE (10 MG TOTAL) BY MOUTH DAILY TWICE A DAY 180 capsule 0  . ibuprofen (ADVIL) 800 MG tablet Take 1 tablet (800 mg total) by mouth every 8 (eight) hours as needed. 30 tablet 1   No current facility-administered medications for this visit.     ALLERGIES: Patient has no known allergies.  Family History  Problem Relation Age of Onset  . Endometriosis Mother   . Hypertension Mother   . Asthma Other   . Diabetes Mellitus II Other   . Alcohol abuse Other        sub  . Diabetes Maternal Grandfather     Social History   Socioeconomic History  . Marital status:  Single    Spouse name: Not on file  . Number of children: Not on file  . Years of education: Not on file  . Highest education level: Not on file  Occupational History  . Not on file  Tobacco Use  . Smoking status: Never Smoker  . Smokeless tobacco: Never Used  Vaping Use  . Vaping Use: Never used  Substance and Sexual Activity  . Alcohol use: No  . Drug use: No  . Sexual activity: Never    Birth control/protection: None  Other Topics Concern  . Not on file  Social History Narrative   HH  Of    Dog  Cats    9th grade  [employer].   Parents Linton Rump and Luster Landsberg good health self empoyed and Cabin crew   Fa safety       Social Determinants of Health   Financial Resource Strain:   . Difficulty of Paying Living Expenses: Not on file  Food Insecurity:   . Worried About Radiation protection practitioner  of Food in the Last Year: Not on file  . Ran Out of Food in the Last Year: Not on file  Transportation Needs:   . Lack of Transportation (Medical): Not on file  . Lack of Transportation (Non-Medical): Not on file  Physical Activity:   . Days of Exercise per Week: Not on file  . Minutes of Exercise per Session: Not on file  Stress:   . Feeling of Stress : Not on file  Social Connections:   . Frequency of Communication with Friends and Family: Not on file  . Frequency of Social Gatherings with Friends and Family: Not on file  . Attends Religious Services: Not on file  . Active Member of Clubs or Organizations: Not on file  . Attends Banker Meetings: Not on file  . Marital Status: Not on file  Intimate Partner Violence:   . Fear of Current or Ex-Partner: Not on file  . Emotionally Abused: Not on file  . Physically Abused: Not on file  . Sexually Abused: Not on file    Review of Systems  All other systems reviewed and are negative.   PHYSICAL EXAMINATION:    BP 110/60   Pulse 89   Ht 5\' 3"  (1.6 m)   Wt 162 lb (73.5 kg)   SpO2 99%   BMI  28.70 kg/m     General appearance: alert, cooperative and appears stated age  Ultrasound images reviewed with the patient.   ASSESSMENT Pelvic pain, normal ultrasound, IUD in place    PLAN Take ibuprofen Call if pain is not improving in the next few weeks She can cancel her IUD check, f/u as needed.

## 2019-10-10 ENCOUNTER — Other Ambulatory Visit: Payer: Self-pay

## 2019-10-20 ENCOUNTER — Telehealth: Payer: Self-pay

## 2019-10-20 NOTE — Telephone Encounter (Signed)
Patient cancelled IUD check due to not wanting the follow up. Patient did not wish to reschedule.

## 2019-10-21 NOTE — Telephone Encounter (Signed)
The patient has already had an ultrasound confirming IUD location. If she is doing well then no f/u is needed.

## 2019-10-29 ENCOUNTER — Ambulatory Visit: Payer: Commercial Managed Care - PPO | Admitting: Obstetrics and Gynecology

## 2020-01-06 ENCOUNTER — Telehealth: Payer: Self-pay | Admitting: Internal Medicine

## 2020-01-06 NOTE — Telephone Encounter (Signed)
Transferred call to [facility]

## 2020-01-07 ENCOUNTER — Telehealth (INDEPENDENT_AMBULATORY_CARE_PROVIDER_SITE_OTHER): Payer: Commercial Managed Care - PPO | Admitting: Internal Medicine

## 2020-01-07 ENCOUNTER — Encounter: Payer: Self-pay | Admitting: Internal Medicine

## 2020-01-07 DIAGNOSIS — F439 Reaction to severe stress, unspecified: Secondary | ICD-10-CM

## 2020-01-07 DIAGNOSIS — Z6379 Other stressful life events affecting family and household: Secondary | ICD-10-CM | POA: Diagnosis not present

## 2020-01-07 DIAGNOSIS — Z7189 Other specified counseling: Secondary | ICD-10-CM

## 2020-01-07 NOTE — Progress Notes (Signed)
Virtual Visit via Video Note  I connected with@ on 01/07/20 at 11:30 AM EST by a video enabled telemedicine application and verified that I am speaking with the correct person using two identifiers. Location patient: home Location provider home office Persons participating in the virtual visit: patient, provider  [place] national recommendations  regarding COVID 19 pandemic   video visit is advised over in office visit for this patient.  Patient aware  of the limitations of evaluation and management by telemedicine and  availability of in person appointments. and agreed to proceed.   HPI: Rose Stewart presents for video visit concern about family stress and health. There has been family disruption recently and she was very worried about her mom texting or reporting suicidal type thoughts and being aware of weapons in the home she arranged involuntary commitment evaluation 2 days ago.  She was evaluated and not felt needed to be committed.  [name] was just not sure what to do and was very worried on the day she arranged the event. She does have a counselor has been talking with her and wanted advice. mom is a patient in our practice daughter is aware of confidentiality.   There has been a recent separation getting divorce.  ROS: See pertinent positives and negatives per HPI.  Past Medical History:  Diagnosis Date  . Amenorrhea   . Back pain 2015   evaluated Dr Cleophas Dunker RX: PT nsaids  . Depression   . Dysmenorrhea   . Endometriosis   . Hx of urticaria 2015   vs contact dermatitis   . Hyperlipidemia   . Rhinitis 2004   evaluation allergy dr Stefan Church felt non allergic    Past Surgical History:  Procedure Laterality Date  . DENTAL SURGERY      Family History  Problem Relation Age of Onset  . Endometriosis Mother   . Hypertension Mother   . Asthma Other   . Diabetes Mellitus II Other   . Alcohol abuse Other        sub  . Diabetes Maternal Grandfather     Social History    Tobacco Use  . Smoking status: Never Smoker  . Smokeless tobacco: Never Used  Vaping Use  . Vaping Use: Never used  Substance Use Topics  . Alcohol use: No  . Drug use: No      Current Outpatient Medications:  .  atomoxetine (STRATTERA) 25 MG capsule, Take 25 mg by mouth every morning., Disp: , Rfl:  .  FLUoxetine (PROZAC) 10 MG capsule, TAKE 1 CAPSULE (10 MG TOTAL) BY MOUTH DAILY TWICE A DAY, Disp: 180 capsule, Rfl: 0 .  ibuprofen (ADVIL) 800 MG tablet, Take 1 tablet (800 mg total) by mouth every 8 (eight) hours as needed., Disp: 30 tablet, Rfl: 1  EXAM: BP Readings from Last 3 Encounters:  09/24/19 110/60  09/23/19 122/68  09/15/19 112/68    VITALS per patient if applicable:  GENERAL: alert, oriented, appears well and in no acute distress  HEENT: atraumatic, conjunttiva clear, no obvious abnormalities on inspection of external nose and ears  NECK: normal movements of the head and neck  PSYCH/NEURO: pleasant and cooperative, no obvious depression or anxiety, speech and thought processing grossly intact   ASSESSMENT AND PLAN:  Discussed the following assessment and plan:    ICD-10-CM   1. Stress  F43.9   2. Other stressful life events affecting family and household  Z63.79   3. Other specified counseling  Z71.89     Counseled.  Encourage patient to continue with her counselor confidential she can encourage her mom to make an appointment counseled also about limitations of confidentiality etc. Let us know how we can help in any way. Based on the information she had it appears to have been an appropriate intervention at the time.  Disc Cussion of plan and treatment with opportunity to ask questions and all were answered. The patient agreed with the plan and demonstrated an understanding of the instructions.   Advised to call back or seek an in-person evaluation if worsening  or having  further concerns . Return depending   how doing.    Berniece Andreas, MD

## 2020-02-16 ENCOUNTER — Other Ambulatory Visit: Payer: Self-pay

## 2020-02-16 ENCOUNTER — Other Ambulatory Visit (INDEPENDENT_AMBULATORY_CARE_PROVIDER_SITE_OTHER): Payer: BC Managed Care – PPO

## 2020-02-16 DIAGNOSIS — E7800 Pure hypercholesterolemia, unspecified: Secondary | ICD-10-CM | POA: Diagnosis not present

## 2020-02-16 LAB — LIPID PANEL
Cholesterol: 247 mg/dL — ABNORMAL HIGH (ref 0–200)
HDL: 41.2 mg/dL (ref >39.00)
LDL Cholesterol: 174 mg/dL — ABNORMAL HIGH (ref 0–99)
NonHDL: 205.37
Total CHOL/HDL Ratio: 6
Triglycerides: 159 mg/dL — ABNORMAL HIGH (ref 0.0–149.0)
VLDL: 31.8 mg/dL (ref 0.0–40.0)

## 2020-02-19 NOTE — Progress Notes (Signed)
Triglycerides are better   but  LDL still high.   How about a referral to dietician/ nutrition specialist for help?Although seems counterintuitive cutting out added sugars and processed carbs can help a lot   May we do a referral?

## 2020-03-23 ENCOUNTER — Encounter: Payer: Self-pay | Admitting: Obstetrics and Gynecology

## 2020-05-24 DIAGNOSIS — F411 Generalized anxiety disorder: Secondary | ICD-10-CM | POA: Diagnosis not present

## 2020-05-24 DIAGNOSIS — F900 Attention-deficit hyperactivity disorder, predominantly inattentive type: Secondary | ICD-10-CM | POA: Diagnosis not present

## 2020-05-24 DIAGNOSIS — F321 Major depressive disorder, single episode, moderate: Secondary | ICD-10-CM | POA: Diagnosis not present

## 2020-07-13 DIAGNOSIS — F321 Major depressive disorder, single episode, moderate: Secondary | ICD-10-CM | POA: Diagnosis not present

## 2020-07-13 DIAGNOSIS — F900 Attention-deficit hyperactivity disorder, predominantly inattentive type: Secondary | ICD-10-CM | POA: Diagnosis not present

## 2020-07-13 DIAGNOSIS — F411 Generalized anxiety disorder: Secondary | ICD-10-CM | POA: Diagnosis not present

## 2020-08-25 DIAGNOSIS — F900 Attention-deficit hyperactivity disorder, predominantly inattentive type: Secondary | ICD-10-CM | POA: Diagnosis not present

## 2020-08-25 DIAGNOSIS — F321 Major depressive disorder, single episode, moderate: Secondary | ICD-10-CM | POA: Diagnosis not present

## 2020-08-25 DIAGNOSIS — F411 Generalized anxiety disorder: Secondary | ICD-10-CM | POA: Diagnosis not present

## 2020-11-02 DIAGNOSIS — F411 Generalized anxiety disorder: Secondary | ICD-10-CM | POA: Diagnosis not present

## 2020-11-02 DIAGNOSIS — F900 Attention-deficit hyperactivity disorder, predominantly inattentive type: Secondary | ICD-10-CM | POA: Diagnosis not present

## 2020-11-02 DIAGNOSIS — F321 Major depressive disorder, single episode, moderate: Secondary | ICD-10-CM | POA: Diagnosis not present

## 2021-01-02 DIAGNOSIS — F900 Attention-deficit hyperactivity disorder, predominantly inattentive type: Secondary | ICD-10-CM | POA: Diagnosis not present

## 2021-01-02 DIAGNOSIS — F411 Generalized anxiety disorder: Secondary | ICD-10-CM | POA: Diagnosis not present

## 2021-01-02 DIAGNOSIS — F321 Major depressive disorder, single episode, moderate: Secondary | ICD-10-CM | POA: Diagnosis not present

## 2021-01-30 ENCOUNTER — Ambulatory Visit (INDEPENDENT_AMBULATORY_CARE_PROVIDER_SITE_OTHER): Payer: Commercial Managed Care - PPO | Admitting: Internal Medicine

## 2021-01-30 VITALS — BP 120/80 | HR 83 | Temp 98.5°F | Ht 63.0 in | Wt 166.4 lb

## 2021-01-30 DIAGNOSIS — N912 Amenorrhea, unspecified: Secondary | ICD-10-CM

## 2021-01-30 DIAGNOSIS — E7800 Pure hypercholesterolemia, unspecified: Secondary | ICD-10-CM | POA: Diagnosis not present

## 2021-01-30 DIAGNOSIS — Z79899 Other long term (current) drug therapy: Secondary | ICD-10-CM

## 2021-01-30 LAB — CBC WITH DIFFERENTIAL/PLATELET
Basophils Absolute: 0.1 10*3/uL (ref 0.0–0.1)
Basophils Relative: 0.9 % (ref 0.0–3.0)
Eosinophils Absolute: 0.2 10*3/uL (ref 0.0–0.7)
Eosinophils Relative: 2 % (ref 0.0–5.0)
HCT: 41.8 % (ref 36.0–46.0)
Hemoglobin: 14 g/dL (ref 12.0–15.0)
Lymphocytes Relative: 29.4 % (ref 12.0–46.0)
Lymphs Abs: 2.5 10*3/uL (ref 0.7–4.0)
MCHC: 33.4 g/dL (ref 30.0–36.0)
MCV: 87.1 fl (ref 78.0–100.0)
Monocytes Absolute: 0.5 10*3/uL (ref 0.1–1.0)
Monocytes Relative: 5.9 % (ref 3.0–12.0)
Neutro Abs: 5.3 10*3/uL (ref 1.4–7.7)
Neutrophils Relative %: 61.8 % (ref 43.0–77.0)
Platelets: 431 10*3/uL — ABNORMAL HIGH (ref 150.0–400.0)
RBC: 4.8 Mil/uL (ref 3.87–5.11)
RDW: 12.7 % (ref 11.5–15.5)
WBC: 8.6 10*3/uL (ref 4.0–10.5)

## 2021-01-30 LAB — TSH: TSH: 3.23 u[IU]/mL (ref 0.35–5.50)

## 2021-01-30 LAB — BASIC METABOLIC PANEL
BUN: 12 mg/dL (ref 6–23)
CO2: 26 mEq/L (ref 19–32)
Calcium: 10.1 mg/dL (ref 8.4–10.5)
Chloride: 100 mEq/L (ref 96–112)
Creatinine, Ser: 0.72 mg/dL (ref 0.40–1.20)
GFR: 119.1 mL/min (ref >60.00)
Glucose, Bld: 91 mg/dL (ref 70–99)
Potassium: 4.6 mEq/L (ref 3.5–5.1)
Sodium: 137 mEq/L (ref 135–145)

## 2021-01-30 LAB — LIPID PANEL
Cholesterol: 245 mg/dL — ABNORMAL HIGH (ref 0–200)
HDL: 37.2 mg/dL — ABNORMAL LOW (ref >39.00)
NonHDL: 207.47
Total CHOL/HDL Ratio: 7
Triglycerides: 300 mg/dL — ABNORMAL HIGH (ref 0.0–149.0)
VLDL: 60 mg/dL — ABNORMAL HIGH (ref 0.0–40.0)

## 2021-01-30 LAB — HEPATIC FUNCTION PANEL
ALT: 17 U/L (ref 0–35)
AST: 16 U/L (ref 0–37)
Albumin: 4.9 g/dL (ref 3.5–5.2)
Alkaline Phosphatase: 39 U/L (ref 39–117)
Bilirubin, Direct: 0.1 mg/dL (ref 0.0–0.3)
Total Bilirubin: 0.5 mg/dL (ref 0.2–1.2)
Total Protein: 7.8 g/dL (ref 6.0–8.3)

## 2021-01-30 LAB — T4, FREE: Free T4: 0.81 ng/dL (ref 0.60–1.60)

## 2021-01-30 LAB — LDL CHOLESTEROL, DIRECT: Direct LDL: 161 mg/dL

## 2021-01-30 LAB — HEMOGLOBIN A1C: Hgb A1c MFr Bld: 5.7 % (ref 4.6–6.5)

## 2021-01-30 NOTE — Progress Notes (Signed)
Chief Complaint  Patient presents with   Hyperlipidemia    Discuss thyroid    HPI: Rose Stewart 21 y.o. come in for concern aboaut  poss thyroid  conditions  Last seen visit video 12 21  stress  doing ok has ft job [employer]  Has had period irregulatries   GYNE dr Oscar La but now has IUD  In  screening    at work  ldl and tg were high    nl hdl   Cooking    limited sodas and eating different .    Still tends to have mid section weight gain.  Last blood work 4 21  ROS: See pertinent positives and negatives per HPI.  30 minutes  during work week.   Lives with dad  cooks  mood ok  No longer on mood meds doing ok since out of school Past Medical History:  Diagnosis Date   Amenorrhea    Back pain 2015   evaluated Dr Cleophas Dunker RX: PT nsaids   Depression    Dysmenorrhea    Endometriosis    Hx of urticaria 2015   vs contact dermatitis    Hyperlipidemia    Rhinitis 2004   evaluation allergy dr Stefan Church felt non allergic    Family History  Problem Relation Age of Onset   Endometriosis Mother    Hypertension Mother    Asthma Other    Diabetes Mellitus II Other    Alcohol abuse Other        sub   Diabetes Maternal Grandfather     Social History   Socioeconomic History   Marital status: Single    Spouse name: Not on file   Number of children: Not on file   Years of education: Not on file   Highest education level: Bachelor's degree (e.g., BA, AB, BS)  Occupational History   Not on file  Tobacco Use   Smoking status: Never   Smokeless tobacco: Never  Vaping Use   Vaping Use: Never used  Substance and Sexual Activity   Alcohol use: No   Drug use: No   Sexual activity: Never    Birth control/protection: None  Other Topics Concern   Not on file  Social History Narrative   HH  Of    Dog  Cats    9th grade  [employer].   Parents Linton Rump and Luster Landsberg good health self empoyed and Cabin crew   Fa safety       Social Determinants of  Health   Financial Resource Strain: Low Risk    Difficulty of Paying Living Expenses: Not very hard  Food Insecurity: No Food Insecurity   Worried About Programme researcher, broadcasting/film/video in the Last Year: Never true   Barista in the Last Year: Never true  Transportation Needs: No Transportation Needs   Lack of Transportation (Medical): No   Lack of Transportation (Non-Medical): No  Physical Activity: Insufficiently Active   Days of Exercise per Week: 4 days   Minutes of Exercise per Session: 30 min  Stress: Stress Concern Present   Feeling of Stress : To some extent  Social Connections: Socially Isolated   Frequency of Communication with Friends and Family: More than three times a week   Frequency of Social Gatherings with Friends and Family: More than three times a week   Attends Religious Services: Never   Database administrator or Organizations: No   Attends Engineer, structural: Not  on file   Marital Status: Never married    Outpatient Medications Prior to Visit  Medication Sig Dispense Refill   atomoxetine (STRATTERA) 25 MG capsule Take 25 mg by mouth every morning.     FLUoxetine (PROZAC) 10 MG capsule TAKE 1 CAPSULE (10 MG TOTAL) BY MOUTH DAILY TWICE A DAY 180 capsule 0   ibuprofen (ADVIL) 800 MG tablet Take 1 tablet (800 mg total) by mouth every 8 (eight) hours as needed. 30 tablet 1   No facility-administered medications prior to visit.     EXAM:  BP 120/80 (BP Location: Left Arm, Patient Position: Sitting, Cuff Size: Normal)    Pulse 83    Temp 98.5 F (36.9 C) (Oral)    Ht 5\' 3"  (1.6 m)    Wt 166 lb 6.4 oz (75.5 kg)    LMP  (LMP Unknown) Comment: IUD   SpO2 98%    BMI 29.48 kg/m   Body mass index is 29.48 kg/m. Wt Readings from Last 3 Encounters:  01/30/21 166 lb 6.4 oz (75.5 kg)  09/24/19 162 lb (73.5 kg)  09/23/19 157 lb 12.8 oz (71.6 kg)    GENERAL: vitals reviewed and listed above, alert, oriented, appears well hydrated and in no acute  distress HEENT: atraumatic, conjunctiva  clear, no obvious abnormalities on inspection of external nose and ears OP :   NECK: no obvious masses thyroid palpable no nodules  LUNGS: clear to auscultation bilaterally, no wheezes, rales or rhonchi, good air movement CV: HRRR, no clubbing cyanosis or  peripheral edema nl cap refill  Abdomen:  Sof,t normal bowel sounds without hepatosplenomegaly, no guarding rebound or masses no CVA tenderness Skin no stria  acne nl hair distribution MS: moves all extremities without noticeable focal  abnormality PSYCH: pleasant and cooperative, no obvious depression or anxiety Lab Results  Component Value Date   WBC 6.6 05/13/2019   HGB 13.4 05/13/2019   HCT 40.0 05/13/2019   PLT 434.0 (H) 05/13/2019   GLUCOSE 82 05/13/2019   CHOL 247 (H) 02/16/2020   TRIG 159.0 (H) 02/16/2020   HDL 41.20 02/16/2020   LDLDIRECT 178.0 05/13/2019   LDLCALC 174 (H) 02/16/2020   ALT 20 05/13/2019   AST 16 05/13/2019   NA 137 05/13/2019   K 4.3 05/13/2019   CL 100 05/13/2019   CREATININE 0.80 05/13/2019   BUN 12 05/13/2019   CO2 29 05/13/2019   TSH 2.75 05/13/2019   BP Readings from Last 3 Encounters:  01/30/21 120/80  09/24/19 110/60  09/23/19 122/68    ASSESSMENT AND PLAN:  Discussed the following assessment and plan:  Elevated cholesterol - Plan: Basic metabolic panel, Lipid panel, TSH, T4, free, CBC with Differential/Platelet, Hemoglobin A1c, Hepatic function panel, Thyroid peroxidase antibody, Thyroid peroxidase antibody, Hepatic function panel, Hemoglobin A1c, CBC with Differential/Platelet, T4, free, TSH, Lipid panel, Basic metabolic panel  Lack of menses - Plan: Basic metabolic panel, Lipid panel, TSH, T4, free, CBC with Differential/Platelet, Hemoglobin A1c, Hepatic function panel, Thyroid peroxidase antibody, Thyroid peroxidase antibody, Hepatic function panel, Hemoglobin A1c, CBC with Differential/Platelet, T4, free, TSH, Lipid panel, Basic metabolic  panel  Medication management - Plan: Basic metabolic panel, Lipid panel, TSH, T4, free, CBC with Differential/Platelet, Hemoglobin A1c, Hepatic function panel, Thyroid peroxidase antibody, Thyroid peroxidase antibody, Hepatic function panel, Hemoglobin A1c, CBC with Differential/Platelet, T4, free, TSH, Lipid panel, Basic metabolic panel Hxof iud  Agree with screening  fasting today ( has seen dietician in past not that helpful;)  -Patient advised to  return or notify health care team  if  new concerns arise.  Patient Instructions  Good to see  you today .   Lab pending   and then go from  there.   Healthy weight loss may help.   Neta MendsWanda K. Jayana Kotula M.D.

## 2021-01-30 NOTE — Patient Instructions (Signed)
Good to see  you today .   Lab pending   and then go from  there.   Healthy weight loss may help.

## 2021-01-31 LAB — THYROID PEROXIDASE ANTIBODY: Thyroperoxidase Ab SerPl-aCnc: <1 IU/mL (ref <9)

## 2021-02-02 NOTE — Progress Notes (Signed)
Thyroid is normal ranges  no evidence of  thyroid disease.  no anemia     kidney function is normal .  Lipid panel  is still  unfavorable .  Low hdl and high triglycerides  But no diabetes  a1c is upper limit of normal . Consider other strict dietary methods  to control  levels  and weight  10 # weight loss may be enough to improve.   Consider stricter mediterranean  low simple carb diet  high fiber  with your exercise.  Some people do plant based eating  Let me know if you want to try talking with dietician again.  We can repeat lipid panel  after life changes again .  Please order future fasting lipid panel for 4-6 months from now

## 2021-02-08 ENCOUNTER — Telehealth: Payer: Self-pay | Admitting: Internal Medicine

## 2021-02-08 NOTE — Telephone Encounter (Signed)
Pt is calling back and would like blood work results 

## 2021-02-09 ENCOUNTER — Telehealth: Payer: Self-pay

## 2021-02-09 ENCOUNTER — Other Ambulatory Visit: Payer: Self-pay

## 2021-02-09 DIAGNOSIS — E7800 Pure hypercholesterolemia, unspecified: Secondary | ICD-10-CM

## 2021-02-09 NOTE — Telephone Encounter (Signed)
Lab (fasting) orders placed

## 2022-02-21 DIAGNOSIS — F321 Major depressive disorder, single episode, moderate: Secondary | ICD-10-CM | POA: Diagnosis not present

## 2022-02-21 DIAGNOSIS — F411 Generalized anxiety disorder: Secondary | ICD-10-CM | POA: Diagnosis not present

## 2022-02-21 DIAGNOSIS — F900 Attention-deficit hyperactivity disorder, predominantly inattentive type: Secondary | ICD-10-CM | POA: Diagnosis not present

## 2022-03-06 ENCOUNTER — Emergency Department (HOSPITAL_BASED_OUTPATIENT_CLINIC_OR_DEPARTMENT_OTHER)
Admission: EM | Admit: 2022-03-06 | Discharge: 2022-03-06 | Disposition: A | Discharge status: Home / Self Care | Payer: BC Managed Care – PPO | Attending: Emergency Medicine | Admitting: Emergency Medicine

## 2022-03-06 ENCOUNTER — Encounter (HOSPITAL_BASED_OUTPATIENT_CLINIC_OR_DEPARTMENT_OTHER): Payer: Self-pay | Admitting: Emergency Medicine

## 2022-03-06 ENCOUNTER — Ambulatory Visit
Admission: EM | Admit: 2022-03-06 | Discharge: 2022-03-06 | Disposition: A | Discharge status: Home / Self Care | Payer: BC Managed Care – PPO

## 2022-03-06 ENCOUNTER — Other Ambulatory Visit: Payer: Self-pay

## 2022-03-06 ENCOUNTER — Emergency Department (HOSPITAL_BASED_OUTPATIENT_CLINIC_OR_DEPARTMENT_OTHER): Payer: BC Managed Care – PPO

## 2022-03-06 DIAGNOSIS — R1011 Right upper quadrant pain: Secondary | ICD-10-CM | POA: Diagnosis not present

## 2022-03-06 DIAGNOSIS — R932 Abnormal findings on diagnostic imaging of liver and biliary tract: Secondary | ICD-10-CM | POA: Diagnosis not present

## 2022-03-06 DIAGNOSIS — R1013 Epigastric pain: Secondary | ICD-10-CM | POA: Diagnosis not present

## 2022-03-06 DIAGNOSIS — R109 Unspecified abdominal pain: Secondary | ICD-10-CM | POA: Diagnosis not present

## 2022-03-06 DIAGNOSIS — K760 Fatty (change of) liver, not elsewhere classified: Secondary | ICD-10-CM | POA: Diagnosis not present

## 2022-03-06 DIAGNOSIS — R1010 Upper abdominal pain, unspecified: Secondary | ICD-10-CM

## 2022-03-06 DIAGNOSIS — R110 Nausea: Secondary | ICD-10-CM | POA: Diagnosis not present

## 2022-03-06 LAB — COMPREHENSIVE METABOLIC PANEL
ALT: 24 U/L (ref 0–44)
AST: 20 U/L (ref 15–41)
Albumin: 4.8 g/dL (ref 3.5–5.0)
Alkaline Phosphatase: 37 U/L — ABNORMAL LOW (ref 38–126)
Anion gap: 8 (ref 5–15)
BUN: 11 mg/dL (ref 6–20)
CO2: 27 mmol/L (ref 22–32)
Calcium: 9.7 mg/dL (ref 8.9–10.3)
Chloride: 102 mmol/L (ref 98–111)
Creatinine, Ser: 0.74 mg/dL (ref 0.44–1.00)
GFR, Estimated: >60 mL/min (ref >60)
Glucose, Bld: 97 mg/dL (ref 70–99)
Potassium: 3.8 mmol/L (ref 3.5–5.1)
Sodium: 137 mmol/L (ref 135–145)
Total Bilirubin: 0.4 mg/dL (ref 0.3–1.2)
Total Protein: 7.5 g/dL (ref 6.5–8.1)

## 2022-03-06 LAB — CBC
HCT: 38.5 % (ref 36.0–46.0)
Hemoglobin: 13.2 g/dL (ref 12.0–15.0)
MCH: 29.7 pg (ref 26.0–34.0)
MCHC: 34.3 g/dL (ref 30.0–36.0)
MCV: 86.7 fL (ref 80.0–100.0)
Platelets: 387 10*3/uL (ref 150–400)
RBC: 4.44 MIL/uL (ref 3.87–5.11)
RDW: 12.3 % (ref 11.5–15.5)
WBC: 7.8 10*3/uL (ref 4.0–10.5)
nRBC: 0 % (ref 0.0–0.2)

## 2022-03-06 LAB — URINALYSIS, ROUTINE W REFLEX MICROSCOPIC
Bilirubin Urine: NEGATIVE
Glucose, UA: NEGATIVE mg/dL
Hgb urine dipstick: NEGATIVE
Ketones, ur: NEGATIVE mg/dL
Leukocytes,Ua: NEGATIVE
Nitrite: NEGATIVE
Protein, ur: NEGATIVE mg/dL
Specific Gravity, Urine: 1.025 (ref 1.005–1.030)
pH: 5.5 (ref 5.0–8.0)

## 2022-03-06 LAB — LIPASE, BLOOD: Lipase: 16 U/L (ref 11–51)

## 2022-03-06 LAB — PREGNANCY, URINE: Preg Test, Ur: NEGATIVE

## 2022-03-06 MED ORDER — ONDANSETRON 4 MG PO TBDP
4.0000 mg | ORAL_TABLET | Freq: Once | ORAL | Status: AC
  Administered 2022-03-06: 4 mg via ORAL
  Filled 2022-03-06: qty 1

## 2022-03-06 MED ORDER — ACETAMINOPHEN 500 MG PO TABS
1000.0000 mg | ORAL_TABLET | Freq: Once | ORAL | Status: AC
  Administered 2022-03-06: 1000 mg via ORAL
  Filled 2022-03-06: qty 2

## 2022-03-06 MED ORDER — ALUM & MAG HYDROXIDE-SIMETH 200-200-20 MG/5ML PO SUSP
30.0000 mL | Freq: Once | ORAL | Status: AC
  Administered 2022-03-06: 30 mL via ORAL
  Filled 2022-03-06: qty 30

## 2022-03-06 MED ORDER — KETOROLAC TROMETHAMINE 15 MG/ML IJ SOLN
15.0000 mg | Freq: Once | INTRAMUSCULAR | Status: AC
  Administered 2022-03-06: 15 mg via INTRAMUSCULAR
  Filled 2022-03-06: qty 1

## 2022-03-06 NOTE — Discharge Instructions (Signed)
Please go to the ER for further evaluation of your symptoms

## 2022-03-06 NOTE — ED Triage Notes (Signed)
Pt c/o upper abd pain and mid back pain (tightness, pulsation).   Started: this morning

## 2022-03-06 NOTE — ED Triage Notes (Signed)
Pt reports epigastric abdominal pain that radiates through to her back. PT reports this started at 0300 this morning. Reports nausea.

## 2022-03-06 NOTE — Discharge Instructions (Signed)
Thank you for coming to [facility] Emergency Department. You were seen for abdominal pain. We did an exam, labs, and imaging, and these showed no acute findings.  Please follow up with your primary care provider within 1 week.   Do not hesitate to return to the ED or call 911 if you experience: -Worsening symptoms -Nausea/vomiting so severe that you can't eat or drink anything -Lightheadedness, passing out -Fevers/chills -Anything else that concerns you

## 2022-03-06 NOTE — ED Provider Notes (Signed)
[facility] Provider Note   CSN: [id number] Arrival date & time: 03/06/22  1723     History {Add pertinent medical, surgical, social history, OB history to HPI:1} Chief Complaint  Patient presents with  . Abdominal Pain    Carle Jeppsen is a 22 y.o. female with h/o headaches, OCP use, who presents with abdominal pain.   Pt reports epigastric abdominal pain that radiates through to her back. PT reports this started at 0300 this morning. Reports nausea.    Per UC note from earlier today: Patient reports this morning she was awoken to a right upper/mid abdominal pain that radiates through towards her back.  It has been constant with wavering intensity since onset.  Describes the pain as sharp and currently rates it as a 5 out of 10.  Cannot identify any alleviating factors but does state it feels worse when she walks.  She did take some ibuprofen with no improvement.  Denies any nausea/vomiting/diarrhea, fevers, chills, bloating, dysuria, GERD symptoms.  No chest pain or shortness of breath.  Last BM was today and was normal for her.  She is been eating and drinking normally.  No history of IBS, Crohn's, GERD, cholecystitis.  Denies any history of abdominal surgeries.  Denies any recent weight loss.  Denies change in diet.  She is never had symptoms like this before.  No other concerns at this time.    Abdominal Pain      Home Medications Prior to Admission medications   Medication Sig Start Date End Date Taking? Authorizing Provider  busPIRone (BUSPAR) 5 MG tablet Take 5 mg by mouth daily. 02/28/22   [provider]  FLUoxetine (PROZAC) 20 MG capsule Take 20 mg by mouth daily. 02/21/22   [provider]  hydrOXYzine (ATARAX) 10 MG tablet Take 10 mg by mouth daily. 02/28/22   [provider]      Allergies    Patient has no known allergies.    Review of Systems   Review of Systems  Gastrointestinal:  Positive  for abdominal pain.   Review of systems {pos/neg:18640::"Negative","Positive"} for ***.  A 10 point review of systems was performed and is negative unless otherwise reported in HPI.  Physical Exam Updated Vital Signs BP (!) 146/95 (BP Location: Right Arm)   Pulse 74   Temp 98.9 F (37.2 C) (Oral)   Resp 17   SpO2 100%  Physical Exam General: Normal appearing {Desc; female/female:11659}, lying in bed.  HEENT: PERRLA, Sclera anicteric, MMM, trachea midline.  Cardiology: RRR, no murmurs/rubs/gallops. BL radial and DP pulses equal bilaterally.  Resp: Normal respiratory rate and effort. CTAB, no wheezes, rhonchi, crackles.  Abd: Soft, non-tender, non-distended. No rebound tenderness or guarding.  GU: Deferred. MSK: No peripheral edema or signs of trauma. Extremities without deformity or TTP. No cyanosis or clubbing. Skin: warm, dry. No rashes or lesions. Back: No CVA tenderness Neuro: A&Ox4, CNs II-XII grossly intact. MAEs. Sensation grossly intact.  Psych: Normal mood and affect.   ED Results / Procedures / Treatments   Labs (all labs ordered are listed, but only abnormal results are displayed) Labs Reviewed  COMPREHENSIVE METABOLIC PANEL - Abnormal; Notable for the following components:      Result Value   Alkaline Phosphatase 37 (*)    All other components within normal limits  LIPASE, BLOOD  CBC  URINALYSIS, ROUTINE W REFLEX MICROSCOPIC  PREGNANCY, URINE    EKG None  Radiology US Abdomen Limited RUQ (LIVER/GB)  Result Date: 03/06/2022 CLINICAL DATA:  Right upper quadrant pain. EXAM: ULTRASOUND ABDOMEN LIMITED RIGHT UPPER QUADRANT COMPARISON:  None Available. FINDINGS: Gallbladder: The gallbladder is partially contracted. No gallstones or wall thickening visualized (2.6 mm). No sonographic Murphy sign noted by sonographer. Common bile duct: Diameter: 4.1 mm Liver: No focal lesion identified. Diffusely increased echogenicity of the liver parenchyma is noted. Portal vein is  patent on color Doppler imaging with normal direction of blood flow towards the liver. Other: None. IMPRESSION: Hepatic steatosis without focal liver lesions. Electronically Signed   By: Virgina Norfolk M.D.   On: 03/06/2022 19:36    Procedures Procedures  {Document cardiac monitor, telemetry assessment procedure when appropriate:1}  Medications Ordered in ED Medications - No data to display  ED Course/ Medical Decision Making/ A&P                          Medical Decision Making Amount and/or Complexity of Data Reviewed Labs: ordered.    This patient presents to the ED for concern of ***, this involves an extensive number of treatment options, and is a complaint that carries with it a high risk of complications and morbidity.  I considered the following differential and admission for this acute, potentially life threatening condition.   MDM:    ***  Clinical Course as of 03/06/22 2040  Tue Mar 06, 2022  2038 Preg Test, Ur: NEGATIVE [HN]  2038 Lipase: 16 [HN]  2039 WBC: 7.8 [HN]  2039 Hemoglobin: 13.2 [HN]  2039 Urinalysis, Routine w reflex microscopic -Urine, Clean Catch Neg [HN]  2039 Creatinine: 0.74 [HN]  2039 Comprehensive metabolic panel(!) unremarkable [HN]  2039 US Abdomen Limited RUQ (LIVER/GB) FINDINGS: Gallbladder:  The gallbladder is partially contracted. No gallstones or wall thickening visualized (2.6 mm). No sonographic Murphy sign noted by sonographer.  Common bile duct:  Diameter: 4.1 mm  Liver:  No focal lesion identified. Diffusely increased echogenicity of the liver parenchyma is noted. Portal vein is patent on color Doppler imaging with normal direction of blood flow towards the liver.  Other: None.  IMPRESSION: Hepatic steatosis without focal liver lesions.   [HN]    Clinical Course User Index [HN] Audley Hose, MD    Labs: I Ordered, and personally interpreted labs.  The pertinent results include:  ***  Imaging Studies  ordered: I ordered imaging studies including *** I independently visualized and interpreted imaging. I agree with the radiologist interpretation  Additional history obtained from ***.  External records from outside source obtained and reviewed including ***  Cardiac Monitoring: .The patient was maintained on a cardiac monitor.  I personally viewed and interpreted the cardiac monitored which showed an underlying rhythm of: ***  Reevaluation: After the interventions noted above, I reevaluated the patient and found that they have :{resolved/improved/worsened:23923::"improved"}  Social Determinants of Health: .***  Disposition:  ***  Co morbidities that complicate the patient evaluation . Past Medical History:  Diagnosis Date  . Amenorrhea   . Back pain 2015   evaluated Dr Layne Benton RX: PT nsaids  . Depression   . Dysmenorrhea   . Endometriosis   . Hx of urticaria 2015   vs contact dermatitis   . Hyperlipidemia   . Rhinitis 2004   evaluation allergy dr [name] felt non allergic     Medicines No orders of the defined types were placed in this encounter.   I have reviewed the patients home medicines and have made adjustments as needed  Problem List / ED Course: Problem List Items Addressed This Visit   None        {Document critical care time when appropriate:1} {Document review of labs and clinical decision tools ie heart score, Chads2Vasc2 etc:1}  {Document your independent review of radiology images, and any outside records:1} {Document your discussion with family members, caretakers, and with consultants:1} {Document social determinants of health affecting pt's care:1} {Document your decision making why or why not admission, treatments were needed:1}  This note was created using dictation software, which may contain spelling or grammatical errors.

## 2022-03-06 NOTE — ED Notes (Addendum)
Patient is being discharged from the Urgent Care and sent to the Emergency Department via POV . Per Julio Sicks NP, patient is in need of higher level of care due to need for further evaluation. Patient is aware and verbalizes understanding of plan of care.  Vitals:   03/06/22 1632  BP: 123/81  Pulse: 76  Resp: 18  Temp: 98.8 F (37.1 C)  SpO2: 98%

## 2022-03-06 NOTE — ED Provider Notes (Addendum)
[facility]    CSN: [id number] Arrival date & time: 03/06/22  1550      History   Chief Complaint Chief Complaint  Patient presents with   Abdominal Pain    Tightness in upper stomach with pain radiating to spine - Entered by patient   Back Pain    HPI Rose Stewart is a 22 y.o. female presents for evaluation of abdominal pain.  Patient reports this morning she was awoken to a right upper/mid abdominal pain that radiates through towards her back.  It has been constant with wavering intensity since onset.  Describes the pain as sharp and currently rates it as a 5 out of 10.  Cannot identify any alleviating factors but does state it feels worse when she walks.  She did take some ibuprofen with no improvement.  Denies any nausea/vomiting/diarrhea, fevers, chills, bloating, dysuria, GERD symptoms.  No chest pain or shortness of breath.  Last BM was today and was normal for her.  She is been eating and drinking normally.  No history of IBS, Crohn's, GERD, cholecystitis.  Denies any history of abdominal surgeries.  Denies any recent weight loss.  Denies change in diet.  She is never had symptoms like this before.  No other concerns at this time.   Abdominal Pain Back Pain Associated symptoms: abdominal pain     Past Medical History:  Diagnosis Date   Amenorrhea    Back pain 2015   evaluated Dr Layne Benton RX: PT nsaids   Depression    Dysmenorrhea    Endometriosis    Hx of urticaria 2015   vs contact dermatitis    Hyperlipidemia    Rhinitis 2004   evaluation allergy dr [name] felt non allergic    Patient Active Problem List   Diagnosis Date Noted   Recurrent headache 03/17/2018   Family history of migraine headaches in mother 03/17/2018   Oral contraceptive use 03/17/2018    Past Surgical History:  Procedure Laterality Date   DENTAL SURGERY      OB History     Gravida  0   Para  0   Term  0   Preterm  0   AB  0   Living  0      SAB  0   IAB   0   Ectopic  0   Multiple  0   Live Births  0            Home Medications    Prior to Admission medications   Medication Sig Start Date End Date Taking? Authorizing Provider  busPIRone (BUSPAR) 5 MG tablet Take 5 mg by mouth daily. 02/28/22  Yes [provider]  FLUoxetine (PROZAC) 20 MG capsule Take 20 mg by mouth daily. 02/21/22  Yes [provider]  hydrOXYzine (ATARAX) 10 MG tablet Take 10 mg by mouth daily. 02/28/22  Yes [provider]    Family History Family History  Problem Relation Age of Onset   Endometriosis Mother    Hypertension Mother    Asthma Other    Diabetes Mellitus II Other    Alcohol abuse Other        sub   Diabetes Maternal Grandfather     Social History Social History   Tobacco Use   Smoking status: Never   Smokeless tobacco: Never  Vaping Use   Vaping Use: Never used  Substance Use Topics   Alcohol use: No   Drug use: No  Allergies   Patient has no known allergies.   Review of Systems Review of Systems  Gastrointestinal:  Positive for abdominal pain.  Musculoskeletal:  Positive for back pain.     Physical Exam Triage Vital Signs ED Triage Vitals  Enc Vitals Group     BP 03/06/22 1632 123/81     Pulse Rate 03/06/22 1632 76     Resp 03/06/22 1632 18     Temp 03/06/22 1632 98.8 F (37.1 C)     Temp Source 03/06/22 1632 Oral     SpO2 03/06/22 1632 98 %     Weight --      Height --      Head Circumference --      Peak Flow --      Pain Score 03/06/22 1631 6     Pain Loc --      Pain Edu? --      Excl. in [place]? --    No data found.  Updated Vital Signs BP 123/81 (BP Location: Right Arm)   Pulse 76   Temp 98.8 F (37.1 C) (Oral)   Resp 18   SpO2 98%   Visual Acuity Right Eye Distance:   Left Eye Distance:   Bilateral Distance:    Right Eye Near:   Left Eye Near:    Bilateral Near:     Physical Exam Vitals and nursing note reviewed.  Constitutional:      Appearance: Normal  appearance.  HENT:     Head: Normocephalic and atraumatic.  Eyes:     Pupils: Pupils are equal, round, and reactive to light.  Cardiovascular:     Rate and Rhythm: Normal rate.  Pulmonary:     Effort: Pulmonary effort is normal.  Abdominal:     General: Bowel sounds are normal. There is no distension.     Palpations: Abdomen is soft.     Tenderness: There is abdominal tenderness in the right lower quadrant, epigastric area and left upper quadrant. There is no rebound. Negative signs include Rovsing's sign and McBurney's sign.  Skin:    General: Skin is warm and dry.  Neurological:     General: No focal deficit present.     Mental Status: She is alert and oriented to person, place, and time.  Psychiatric:        Mood and Affect: Mood normal.        Behavior: Behavior normal.      UC Treatments / Results  Labs (all labs ordered are listed, but only abnormal results are displayed) Labs Reviewed - No data to display  EKG   Radiology No results found.  Procedures Procedures (including critical care time)  Medications Ordered in UC Medications - No data to display  Initial Impression / Assessment and Plan / UC Course  I have reviewed the triage vital signs and the nursing notes.  Pertinent labs & imaging results that were available during my care of the patient were reviewed by me and considered in my medical decision making (see chart for details).     Reviewed exam and symptoms with patient.  Discussed limitations and abilities of urgent care Given persistent upper abdominal pain with radiation to the back advise she go to the emergency room for further evaluation and treatment.  She is in agreement with plan will go POV to the emergency room.  She was instructed to pull over and call 911 for any worsening symptoms that occur in transit and she verbalized understanding.  Final Clinical Impressions(s) / UC Diagnoses   Final diagnoses:  Pain of upper abdomen      Discharge Instructions      Please go to the ER for further evaluation of your symptoms    ED Prescriptions   None    PDMP not reviewed this encounter.   Melynda Ripple, NP 03/06/22 1703    Melynda Ripple, NP 03/06/22 1704    Melynda Ripple, NP 03/06/22 [phone]

## 2022-03-08 ENCOUNTER — Ambulatory Visit (INDEPENDENT_AMBULATORY_CARE_PROVIDER_SITE_OTHER): Payer: BC Managed Care – PPO | Admitting: Family Medicine

## 2022-03-08 ENCOUNTER — Encounter: Payer: Self-pay | Admitting: Family Medicine

## 2022-03-08 VITALS — BP 130/76 | HR 70 | Temp 98.2°F | Ht 63.0 in | Wt 165.6 lb

## 2022-03-08 DIAGNOSIS — R1011 Right upper quadrant pain: Secondary | ICD-10-CM | POA: Diagnosis not present

## 2022-03-08 DIAGNOSIS — Z975 Presence of (intrauterine) contraceptive device: Secondary | ICD-10-CM | POA: Diagnosis not present

## 2022-03-08 DIAGNOSIS — R1033 Periumbilical pain: Secondary | ICD-10-CM | POA: Diagnosis not present

## 2022-03-08 DIAGNOSIS — K760 Fatty (change of) liver, not elsewhere classified: Secondary | ICD-10-CM | POA: Diagnosis not present

## 2022-03-08 MED ORDER — TRAMADOL HCL 50 MG PO TABS: 50.0000 mg | ORAL_TABLET | Freq: Three times a day (TID) | ORAL | 0 refills | Status: AC | PRN

## 2022-03-08 NOTE — Progress Notes (Signed)
Established Patient Office Visit   Subjective  Patient ID: Rose Stewart, female    DOB: Sep 12, 2000  Age: 22 y.o. MRN: [id number]  Chief Complaint  Patient presents with   Hospitalization Follow-up    Patient is a 22 year old female followed by Dr. Regis Bill and seen today for ED follow-up.  Patient seen in ED on 03/06/2022 for RUQ pain.  In ED labs including CMP, UA, and hCG negative.  RUQ ultrasound with hepatic steatosis.  Patient advised to follow-up with PCP.   Patient states pain starts in the epigastric/RUQ area and radiates around R flank to R back.  Pain started Tuesday around 3 AM and was not relieved by ibuprofen.  Pain described as 8-9/10 at worst but may decreased to 6/10.  Pain worse with standing or eating.  Patient also endorses some nausea.  Patient ate toast this morning.  Afraid to eat due to discomfort.  Patient denies dysuria, suprapubic pressure emesis, fever, chills, diarrhea, constipation.  Patient has IUD in place since 2021.  LMP 2021.      ROS Negative unless stated above    Objective:     BP 130/76 (BP Location: Left Arm, Patient Position: Sitting, Cuff Size: Normal)   Pulse 70   Temp 98.2 F (36.8 C) (Oral)   Ht 5' 3"$  (1.6 m)   Wt 165 lb 9.6 oz (75.1 kg)   SpO2 99%   BMI 29.33 kg/m    Physical Exam Constitutional:      General: She is not in acute distress.    Appearance: Normal appearance.  HENT:     Head: Normocephalic and atraumatic.     Nose: Nose normal.     Mouth/Throat:     Mouth: Mucous membranes are moist.  Cardiovascular:     Rate and Rhythm: Normal rate and regular rhythm.     Heart sounds: Normal heart sounds. No murmur heard.    No gallop.  Pulmonary:     Effort: Pulmonary effort is normal. No respiratory distress.     Breath sounds: Normal breath sounds. No wheezing, rhonchi or rales.  Abdominal:     General: Bowel sounds are normal.     Palpations: Abdomen is soft.     Tenderness: There is abdominal tenderness. There  is right CVA tenderness and left CVA tenderness.       Comments: RUQ TTP.  RLQ/epigastric tenderness on exam.  Skin:    General: Skin is warm and dry.  Neurological:     Mental Status: She is alert and oriented to person, place, and time.   No results found for any visits on 03/08/22.    Assessment & Plan:  [place] RUQ abdominal pain -     CT ABDOMEN PELVIS W CONTRAST; Future -     traMADol HCl; Take 1 tablet (50 mg total) by mouth every 8 (eight) hours as needed for up to 5 days.  Dispense: 15 tablet; Refill: 0  Periumbilical abdominal pain -     CT ABDOMEN PELVIS W CONTRAST; Future -     traMADol HCl; Take 1 tablet (50 mg total) by mouth every 8 (eight) hours as needed for up to 5 days.  Dispense: 15 tablet; Refill: 0  IUD (intrauterine device) in place  Hepatic steatosis  Discussed various causes of RUQ abdominal pain including cholelithiasis, cholecystitis, choledocholithiasis, pancreatitis, GERD, gastric ulcer.  Also consider hepatic cyst.  Labs and imaging from recent ED visit reviewed with patient.  UTI or pyelonephritis less likely given negative  UA from 03/06/2022.  Given RLQ/epigastric pain on exam consider IUD perforating uterine wall.  Will obtain CT abdomen/pelvis to further evaluate current symptoms.  Trial of tramadol for severe pain.  Otherwise use heat, Tylenol/ibuprofen for pain. Given strict precautions.  Further recommendations based on imaging.  Advised to continue lifestyle and diet modifications for history of elevated cholesterol and hepatic steatosis noted on RUQ ultrasound.  Return if symptoms worsen or fail to improve.   Billie Ruddy, MD

## 2022-03-14 ENCOUNTER — Encounter: Payer: Self-pay | Admitting: Family Medicine

## 2022-03-28 DIAGNOSIS — F900 Attention-deficit hyperactivity disorder, predominantly inattentive type: Secondary | ICD-10-CM | POA: Diagnosis not present

## 2022-03-28 DIAGNOSIS — F321 Major depressive disorder, single episode, moderate: Secondary | ICD-10-CM | POA: Diagnosis not present

## 2022-03-28 DIAGNOSIS — F411 Generalized anxiety disorder: Secondary | ICD-10-CM | POA: Diagnosis not present

## 2022-04-05 ENCOUNTER — Ambulatory Visit
Admission: RE | Admit: 2022-04-05 | Discharge: 2022-04-05 | Disposition: A | Discharge status: Home / Self Care | Payer: BC Managed Care – PPO | Source: Ambulatory Visit | Attending: Family Medicine | Admitting: Family Medicine

## 2022-04-05 DIAGNOSIS — R1031 Right lower quadrant pain: Secondary | ICD-10-CM | POA: Diagnosis not present

## 2022-04-05 DIAGNOSIS — R1033 Periumbilical pain: Secondary | ICD-10-CM

## 2022-04-05 DIAGNOSIS — R1011 Right upper quadrant pain: Secondary | ICD-10-CM

## 2022-04-05 MED ORDER — IOPAMIDOL (ISOVUE-300) INJECTION 61%
100.0000 mL | Freq: Once | INTRAVENOUS | Status: AC | PRN
  Administered 2022-04-05: 100 mL via INTRAVENOUS

## 2022-05-09 DIAGNOSIS — F321 Major depressive disorder, single episode, moderate: Secondary | ICD-10-CM | POA: Diagnosis not present

## 2022-05-09 DIAGNOSIS — F411 Generalized anxiety disorder: Secondary | ICD-10-CM | POA: Diagnosis not present

## 2022-05-09 DIAGNOSIS — F900 Attention-deficit hyperactivity disorder, predominantly inattentive type: Secondary | ICD-10-CM | POA: Diagnosis not present

## 2022-06-28 DIAGNOSIS — F411 Generalized anxiety disorder: Secondary | ICD-10-CM | POA: Diagnosis not present

## 2022-06-28 DIAGNOSIS — F900 Attention-deficit hyperactivity disorder, predominantly inattentive type: Secondary | ICD-10-CM | POA: Diagnosis not present

## 2022-06-28 DIAGNOSIS — F321 Major depressive disorder, single episode, moderate: Secondary | ICD-10-CM | POA: Diagnosis not present

## 2022-07-05 DIAGNOSIS — M9901 Segmental and somatic dysfunction of cervical region: Secondary | ICD-10-CM | POA: Diagnosis not present

## 2022-07-05 DIAGNOSIS — M5414 Radiculopathy, thoracic region: Secondary | ICD-10-CM | POA: Diagnosis not present

## 2022-07-05 DIAGNOSIS — M9902 Segmental and somatic dysfunction of thoracic region: Secondary | ICD-10-CM | POA: Diagnosis not present

## 2022-07-05 DIAGNOSIS — M531 Cervicobrachial syndrome: Secondary | ICD-10-CM | POA: Diagnosis not present

## 2022-07-10 DIAGNOSIS — M531 Cervicobrachial syndrome: Secondary | ICD-10-CM | POA: Diagnosis not present

## 2022-07-10 DIAGNOSIS — M9902 Segmental and somatic dysfunction of thoracic region: Secondary | ICD-10-CM | POA: Diagnosis not present

## 2022-07-10 DIAGNOSIS — M5414 Radiculopathy, thoracic region: Secondary | ICD-10-CM | POA: Diagnosis not present

## 2022-07-10 DIAGNOSIS — M9901 Segmental and somatic dysfunction of cervical region: Secondary | ICD-10-CM | POA: Diagnosis not present

## 2022-07-13 DIAGNOSIS — M9902 Segmental and somatic dysfunction of thoracic region: Secondary | ICD-10-CM | POA: Diagnosis not present

## 2022-07-13 DIAGNOSIS — M531 Cervicobrachial syndrome: Secondary | ICD-10-CM | POA: Diagnosis not present

## 2022-07-13 DIAGNOSIS — M9901 Segmental and somatic dysfunction of cervical region: Secondary | ICD-10-CM | POA: Diagnosis not present

## 2022-07-13 DIAGNOSIS — M5414 Radiculopathy, thoracic region: Secondary | ICD-10-CM | POA: Diagnosis not present

## 2022-07-18 DIAGNOSIS — M9901 Segmental and somatic dysfunction of cervical region: Secondary | ICD-10-CM | POA: Diagnosis not present

## 2022-07-18 DIAGNOSIS — M5414 Radiculopathy, thoracic region: Secondary | ICD-10-CM | POA: Diagnosis not present

## 2022-07-18 DIAGNOSIS — M531 Cervicobrachial syndrome: Secondary | ICD-10-CM | POA: Diagnosis not present

## 2022-07-18 DIAGNOSIS — M9902 Segmental and somatic dysfunction of thoracic region: Secondary | ICD-10-CM | POA: Diagnosis not present

## 2022-07-27 DIAGNOSIS — M9902 Segmental and somatic dysfunction of thoracic region: Secondary | ICD-10-CM | POA: Diagnosis not present

## 2022-07-27 DIAGNOSIS — M9901 Segmental and somatic dysfunction of cervical region: Secondary | ICD-10-CM | POA: Diagnosis not present

## 2022-07-27 DIAGNOSIS — M531 Cervicobrachial syndrome: Secondary | ICD-10-CM | POA: Diagnosis not present

## 2022-07-27 DIAGNOSIS — M5414 Radiculopathy, thoracic region: Secondary | ICD-10-CM | POA: Diagnosis not present

## 2022-07-31 DIAGNOSIS — M9901 Segmental and somatic dysfunction of cervical region: Secondary | ICD-10-CM | POA: Diagnosis not present

## 2022-07-31 DIAGNOSIS — M9902 Segmental and somatic dysfunction of thoracic region: Secondary | ICD-10-CM | POA: Diagnosis not present

## 2022-07-31 DIAGNOSIS — M531 Cervicobrachial syndrome: Secondary | ICD-10-CM | POA: Diagnosis not present

## 2022-07-31 DIAGNOSIS — M5414 Radiculopathy, thoracic region: Secondary | ICD-10-CM | POA: Diagnosis not present

## 2022-08-03 DIAGNOSIS — M9902 Segmental and somatic dysfunction of thoracic region: Secondary | ICD-10-CM | POA: Diagnosis not present

## 2022-08-03 DIAGNOSIS — M5414 Radiculopathy, thoracic region: Secondary | ICD-10-CM | POA: Diagnosis not present

## 2022-08-03 DIAGNOSIS — M9901 Segmental and somatic dysfunction of cervical region: Secondary | ICD-10-CM | POA: Diagnosis not present

## 2022-08-03 DIAGNOSIS — M531 Cervicobrachial syndrome: Secondary | ICD-10-CM | POA: Diagnosis not present

## 2022-08-07 DIAGNOSIS — M9901 Segmental and somatic dysfunction of cervical region: Secondary | ICD-10-CM | POA: Diagnosis not present

## 2022-08-07 DIAGNOSIS — M531 Cervicobrachial syndrome: Secondary | ICD-10-CM | POA: Diagnosis not present

## 2022-08-07 DIAGNOSIS — M9902 Segmental and somatic dysfunction of thoracic region: Secondary | ICD-10-CM | POA: Diagnosis not present

## 2022-08-07 DIAGNOSIS — M5414 Radiculopathy, thoracic region: Secondary | ICD-10-CM | POA: Diagnosis not present

## 2022-08-10 DIAGNOSIS — M9902 Segmental and somatic dysfunction of thoracic region: Secondary | ICD-10-CM | POA: Diagnosis not present

## 2022-08-10 DIAGNOSIS — M531 Cervicobrachial syndrome: Secondary | ICD-10-CM | POA: Diagnosis not present

## 2022-08-10 DIAGNOSIS — M5414 Radiculopathy, thoracic region: Secondary | ICD-10-CM | POA: Diagnosis not present

## 2022-08-10 DIAGNOSIS — M9901 Segmental and somatic dysfunction of cervical region: Secondary | ICD-10-CM | POA: Diagnosis not present

## 2022-08-14 DIAGNOSIS — M5414 Radiculopathy, thoracic region: Secondary | ICD-10-CM | POA: Diagnosis not present

## 2022-08-14 DIAGNOSIS — M531 Cervicobrachial syndrome: Secondary | ICD-10-CM | POA: Diagnosis not present

## 2022-08-14 DIAGNOSIS — M9901 Segmental and somatic dysfunction of cervical region: Secondary | ICD-10-CM | POA: Diagnosis not present

## 2022-08-14 DIAGNOSIS — M9902 Segmental and somatic dysfunction of thoracic region: Secondary | ICD-10-CM | POA: Diagnosis not present

## 2022-08-17 DIAGNOSIS — M531 Cervicobrachial syndrome: Secondary | ICD-10-CM | POA: Diagnosis not present

## 2022-08-17 DIAGNOSIS — M5414 Radiculopathy, thoracic region: Secondary | ICD-10-CM | POA: Diagnosis not present

## 2022-08-17 DIAGNOSIS — M9901 Segmental and somatic dysfunction of cervical region: Secondary | ICD-10-CM | POA: Diagnosis not present

## 2022-08-17 DIAGNOSIS — M9902 Segmental and somatic dysfunction of thoracic region: Secondary | ICD-10-CM | POA: Diagnosis not present

## 2022-08-20 DIAGNOSIS — F411 Generalized anxiety disorder: Secondary | ICD-10-CM | POA: Diagnosis not present

## 2022-08-20 DIAGNOSIS — F321 Major depressive disorder, single episode, moderate: Secondary | ICD-10-CM | POA: Diagnosis not present

## 2022-08-21 DIAGNOSIS — M5414 Radiculopathy, thoracic region: Secondary | ICD-10-CM | POA: Diagnosis not present

## 2022-08-21 DIAGNOSIS — M531 Cervicobrachial syndrome: Secondary | ICD-10-CM | POA: Diagnosis not present

## 2022-08-21 DIAGNOSIS — M9901 Segmental and somatic dysfunction of cervical region: Secondary | ICD-10-CM | POA: Diagnosis not present

## 2022-08-21 DIAGNOSIS — M9902 Segmental and somatic dysfunction of thoracic region: Secondary | ICD-10-CM | POA: Diagnosis not present

## 2022-08-28 DIAGNOSIS — M9902 Segmental and somatic dysfunction of thoracic region: Secondary | ICD-10-CM | POA: Diagnosis not present

## 2022-08-28 DIAGNOSIS — M5414 Radiculopathy, thoracic region: Secondary | ICD-10-CM | POA: Diagnosis not present

## 2022-08-28 DIAGNOSIS — M531 Cervicobrachial syndrome: Secondary | ICD-10-CM | POA: Diagnosis not present

## 2022-08-28 DIAGNOSIS — M9901 Segmental and somatic dysfunction of cervical region: Secondary | ICD-10-CM | POA: Diagnosis not present

## 2022-10-02 DIAGNOSIS — Z23 Encounter for immunization: Secondary | ICD-10-CM | POA: Diagnosis not present

## 2022-10-09 DIAGNOSIS — F411 Generalized anxiety disorder: Secondary | ICD-10-CM | POA: Diagnosis not present

## 2022-10-09 DIAGNOSIS — M9901 Segmental and somatic dysfunction of cervical region: Secondary | ICD-10-CM | POA: Diagnosis not present

## 2022-10-09 DIAGNOSIS — M5414 Radiculopathy, thoracic region: Secondary | ICD-10-CM | POA: Diagnosis not present

## 2022-10-09 DIAGNOSIS — F321 Major depressive disorder, single episode, moderate: Secondary | ICD-10-CM | POA: Diagnosis not present

## 2022-10-09 DIAGNOSIS — M531 Cervicobrachial syndrome: Secondary | ICD-10-CM | POA: Diagnosis not present

## 2022-10-09 DIAGNOSIS — M9902 Segmental and somatic dysfunction of thoracic region: Secondary | ICD-10-CM | POA: Diagnosis not present

## 2022-10-16 DIAGNOSIS — F411 Generalized anxiety disorder: Secondary | ICD-10-CM | POA: Diagnosis not present

## 2022-10-16 DIAGNOSIS — F321 Major depressive disorder, single episode, moderate: Secondary | ICD-10-CM | POA: Diagnosis not present

## 2022-10-24 DIAGNOSIS — F321 Major depressive disorder, single episode, moderate: Secondary | ICD-10-CM | POA: Diagnosis not present

## 2022-10-24 DIAGNOSIS — F411 Generalized anxiety disorder: Secondary | ICD-10-CM | POA: Diagnosis not present

## 2022-11-06 DIAGNOSIS — M9901 Segmental and somatic dysfunction of cervical region: Secondary | ICD-10-CM | POA: Diagnosis not present

## 2022-11-06 DIAGNOSIS — M9902 Segmental and somatic dysfunction of thoracic region: Secondary | ICD-10-CM | POA: Diagnosis not present

## 2022-11-06 DIAGNOSIS — M531 Cervicobrachial syndrome: Secondary | ICD-10-CM | POA: Diagnosis not present

## 2022-11-06 DIAGNOSIS — M5414 Radiculopathy, thoracic region: Secondary | ICD-10-CM | POA: Diagnosis not present

## 2022-11-29 DIAGNOSIS — F321 Major depressive disorder, single episode, moderate: Secondary | ICD-10-CM | POA: Diagnosis not present

## 2022-11-29 DIAGNOSIS — F411 Generalized anxiety disorder: Secondary | ICD-10-CM | POA: Diagnosis not present

## 2022-12-18 DIAGNOSIS — M5414 Radiculopathy, thoracic region: Secondary | ICD-10-CM | POA: Diagnosis not present

## 2022-12-18 DIAGNOSIS — M531 Cervicobrachial syndrome: Secondary | ICD-10-CM | POA: Diagnosis not present

## 2022-12-18 DIAGNOSIS — M9901 Segmental and somatic dysfunction of cervical region: Secondary | ICD-10-CM | POA: Diagnosis not present

## 2022-12-18 DIAGNOSIS — M9902 Segmental and somatic dysfunction of thoracic region: Secondary | ICD-10-CM | POA: Diagnosis not present

## 2022-12-26 DIAGNOSIS — F411 Generalized anxiety disorder: Secondary | ICD-10-CM | POA: Diagnosis not present

## 2022-12-26 DIAGNOSIS — F321 Major depressive disorder, single episode, moderate: Secondary | ICD-10-CM | POA: Diagnosis not present

## 2023-01-31 DIAGNOSIS — M9902 Segmental and somatic dysfunction of thoracic region: Secondary | ICD-10-CM | POA: Diagnosis not present

## 2023-01-31 DIAGNOSIS — M9901 Segmental and somatic dysfunction of cervical region: Secondary | ICD-10-CM | POA: Diagnosis not present

## 2023-01-31 DIAGNOSIS — M5414 Radiculopathy, thoracic region: Secondary | ICD-10-CM | POA: Diagnosis not present

## 2023-01-31 DIAGNOSIS — M531 Cervicobrachial syndrome: Secondary | ICD-10-CM | POA: Diagnosis not present

## 2023-02-07 DIAGNOSIS — F321 Major depressive disorder, single episode, moderate: Secondary | ICD-10-CM | POA: Diagnosis not present

## 2023-02-07 DIAGNOSIS — F411 Generalized anxiety disorder: Secondary | ICD-10-CM | POA: Diagnosis not present

## 2023-03-14 DIAGNOSIS — M5414 Radiculopathy, thoracic region: Secondary | ICD-10-CM | POA: Diagnosis not present

## 2023-03-14 DIAGNOSIS — M9902 Segmental and somatic dysfunction of thoracic region: Secondary | ICD-10-CM | POA: Diagnosis not present

## 2023-03-14 DIAGNOSIS — M9901 Segmental and somatic dysfunction of cervical region: Secondary | ICD-10-CM | POA: Diagnosis not present

## 2023-03-14 DIAGNOSIS — M531 Cervicobrachial syndrome: Secondary | ICD-10-CM | POA: Diagnosis not present

## 2023-03-20 DIAGNOSIS — F411 Generalized anxiety disorder: Secondary | ICD-10-CM | POA: Diagnosis not present

## 2023-03-20 DIAGNOSIS — F321 Major depressive disorder, single episode, moderate: Secondary | ICD-10-CM | POA: Diagnosis not present

## 2023-04-11 DIAGNOSIS — M5414 Radiculopathy, thoracic region: Secondary | ICD-10-CM | POA: Diagnosis not present

## 2023-04-11 DIAGNOSIS — M531 Cervicobrachial syndrome: Secondary | ICD-10-CM | POA: Diagnosis not present

## 2023-04-11 DIAGNOSIS — M9901 Segmental and somatic dysfunction of cervical region: Secondary | ICD-10-CM | POA: Diagnosis not present

## 2023-04-11 DIAGNOSIS — M9902 Segmental and somatic dysfunction of thoracic region: Secondary | ICD-10-CM | POA: Diagnosis not present

## 2023-05-14 DIAGNOSIS — F411 Generalized anxiety disorder: Secondary | ICD-10-CM | POA: Diagnosis not present

## 2023-05-14 DIAGNOSIS — F321 Major depressive disorder, single episode, moderate: Secondary | ICD-10-CM | POA: Diagnosis not present

## 2023-05-23 DIAGNOSIS — M9902 Segmental and somatic dysfunction of thoracic region: Secondary | ICD-10-CM | POA: Diagnosis not present

## 2023-05-23 DIAGNOSIS — M531 Cervicobrachial syndrome: Secondary | ICD-10-CM | POA: Diagnosis not present

## 2023-05-23 DIAGNOSIS — M9901 Segmental and somatic dysfunction of cervical region: Secondary | ICD-10-CM | POA: Diagnosis not present

## 2023-05-23 DIAGNOSIS — M5414 Radiculopathy, thoracic region: Secondary | ICD-10-CM | POA: Diagnosis not present

## 2023-06-21 DIAGNOSIS — F411 Generalized anxiety disorder: Secondary | ICD-10-CM | POA: Diagnosis not present

## 2023-06-21 DIAGNOSIS — E782 Mixed hyperlipidemia: Secondary | ICD-10-CM | POA: Diagnosis not present

## 2023-06-21 DIAGNOSIS — Z0000 Encounter for general adult medical examination without abnormal findings: Secondary | ICD-10-CM | POA: Diagnosis not present

## 2023-06-21 DIAGNOSIS — E538 Deficiency of other specified B group vitamins: Secondary | ICD-10-CM | POA: Diagnosis not present

## 2023-06-21 DIAGNOSIS — E663 Overweight: Secondary | ICD-10-CM | POA: Diagnosis not present

## 2023-06-21 DIAGNOSIS — E569 Vitamin deficiency, unspecified: Secondary | ICD-10-CM | POA: Diagnosis not present

## 2023-06-21 DIAGNOSIS — Z6829 Body mass index (BMI) 29.0-29.9, adult: Secondary | ICD-10-CM | POA: Diagnosis not present

## 2023-06-21 DIAGNOSIS — R102 Pelvic and perineal pain: Secondary | ICD-10-CM | POA: Diagnosis not present

## 2023-06-24 DIAGNOSIS — Z113 Encounter for screening for infections with a predominantly sexual mode of transmission: Secondary | ICD-10-CM | POA: Diagnosis not present

## 2023-06-24 DIAGNOSIS — Z30432 Encounter for removal of intrauterine contraceptive device: Secondary | ICD-10-CM | POA: Diagnosis not present

## 2023-06-24 DIAGNOSIS — R102 Pelvic and perineal pain: Secondary | ICD-10-CM | POA: Diagnosis not present

## 2023-06-24 DIAGNOSIS — N898 Other specified noninflammatory disorders of vagina: Secondary | ICD-10-CM | POA: Diagnosis not present

## 2023-07-01 DIAGNOSIS — F411 Generalized anxiety disorder: Secondary | ICD-10-CM | POA: Diagnosis not present

## 2023-07-01 DIAGNOSIS — F321 Major depressive disorder, single episode, moderate: Secondary | ICD-10-CM | POA: Diagnosis not present

## 2023-07-04 DIAGNOSIS — M9902 Segmental and somatic dysfunction of thoracic region: Secondary | ICD-10-CM | POA: Diagnosis not present

## 2023-07-04 DIAGNOSIS — M5414 Radiculopathy, thoracic region: Secondary | ICD-10-CM | POA: Diagnosis not present

## 2023-07-04 DIAGNOSIS — M531 Cervicobrachial syndrome: Secondary | ICD-10-CM | POA: Diagnosis not present

## 2023-07-04 DIAGNOSIS — M9901 Segmental and somatic dysfunction of cervical region: Secondary | ICD-10-CM | POA: Diagnosis not present

## 2023-07-22 DIAGNOSIS — Z01419 Encounter for gynecological examination (general) (routine) without abnormal findings: Secondary | ICD-10-CM | POA: Diagnosis not present

## 2023-07-22 DIAGNOSIS — Z124 Encounter for screening for malignant neoplasm of cervix: Secondary | ICD-10-CM | POA: Diagnosis not present

## 2023-07-29 DIAGNOSIS — R109 Unspecified abdominal pain: Secondary | ICD-10-CM | POA: Diagnosis not present

## 2023-07-29 DIAGNOSIS — K760 Fatty (change of) liver, not elsewhere classified: Secondary | ICD-10-CM | POA: Diagnosis not present

## 2023-07-29 DIAGNOSIS — R112 Nausea with vomiting, unspecified: Secondary | ICD-10-CM | POA: Diagnosis not present

## 2023-07-29 DIAGNOSIS — R1031 Right lower quadrant pain: Secondary | ICD-10-CM | POA: Diagnosis not present

## 2023-07-29 DIAGNOSIS — N289 Disorder of kidney and ureter, unspecified: Secondary | ICD-10-CM | POA: Diagnosis not present

## 2023-08-05 DIAGNOSIS — Z6829 Body mass index (BMI) 29.0-29.9, adult: Secondary | ICD-10-CM | POA: Diagnosis not present

## 2023-08-05 DIAGNOSIS — K760 Fatty (change of) liver, not elsewhere classified: Secondary | ICD-10-CM | POA: Diagnosis not present

## 2023-08-05 DIAGNOSIS — N2889 Other specified disorders of kidney and ureter: Secondary | ICD-10-CM | POA: Diagnosis not present

## 2023-08-15 DIAGNOSIS — M9901 Segmental and somatic dysfunction of cervical region: Secondary | ICD-10-CM | POA: Diagnosis not present

## 2023-08-15 DIAGNOSIS — M5414 Radiculopathy, thoracic region: Secondary | ICD-10-CM | POA: Diagnosis not present

## 2023-08-15 DIAGNOSIS — M531 Cervicobrachial syndrome: Secondary | ICD-10-CM | POA: Diagnosis not present

## 2023-08-15 DIAGNOSIS — M9902 Segmental and somatic dysfunction of thoracic region: Secondary | ICD-10-CM | POA: Diagnosis not present

## 2023-08-26 DIAGNOSIS — F411 Generalized anxiety disorder: Secondary | ICD-10-CM | POA: Diagnosis not present

## 2023-08-26 DIAGNOSIS — F321 Major depressive disorder, single episode, moderate: Secondary | ICD-10-CM | POA: Diagnosis not present

## 2023-09-24 DIAGNOSIS — Z23 Encounter for immunization: Secondary | ICD-10-CM | POA: Diagnosis not present

## 2023-09-25 DIAGNOSIS — N2889 Other specified disorders of kidney and ureter: Secondary | ICD-10-CM | POA: Diagnosis not present

## 2023-09-25 DIAGNOSIS — K760 Fatty (change of) liver, not elsewhere classified: Secondary | ICD-10-CM | POA: Diagnosis not present

## 2023-09-26 DIAGNOSIS — M9902 Segmental and somatic dysfunction of thoracic region: Secondary | ICD-10-CM | POA: Diagnosis not present

## 2023-09-26 DIAGNOSIS — M5414 Radiculopathy, thoracic region: Secondary | ICD-10-CM | POA: Diagnosis not present

## 2023-09-26 DIAGNOSIS — M531 Cervicobrachial syndrome: Secondary | ICD-10-CM | POA: Diagnosis not present

## 2023-09-26 DIAGNOSIS — M9901 Segmental and somatic dysfunction of cervical region: Secondary | ICD-10-CM | POA: Diagnosis not present

## 2023-10-21 DIAGNOSIS — F411 Generalized anxiety disorder: Secondary | ICD-10-CM | POA: Diagnosis not present

## 2023-10-21 DIAGNOSIS — F321 Major depressive disorder, single episode, moderate: Secondary | ICD-10-CM | POA: Diagnosis not present

## 2023-11-05 DIAGNOSIS — M5414 Radiculopathy, thoracic region: Secondary | ICD-10-CM | POA: Diagnosis not present

## 2023-11-05 DIAGNOSIS — M9901 Segmental and somatic dysfunction of cervical region: Secondary | ICD-10-CM | POA: Diagnosis not present

## 2023-11-05 DIAGNOSIS — M531 Cervicobrachial syndrome: Secondary | ICD-10-CM | POA: Diagnosis not present

## 2023-11-05 DIAGNOSIS — M9902 Segmental and somatic dysfunction of thoracic region: Secondary | ICD-10-CM | POA: Diagnosis not present

## 2023-12-17 DIAGNOSIS — M531 Cervicobrachial syndrome: Secondary | ICD-10-CM | POA: Diagnosis not present

## 2023-12-17 DIAGNOSIS — M9902 Segmental and somatic dysfunction of thoracic region: Secondary | ICD-10-CM | POA: Diagnosis not present

## 2023-12-17 DIAGNOSIS — M5414 Radiculopathy, thoracic region: Secondary | ICD-10-CM | POA: Diagnosis not present

## 2023-12-17 DIAGNOSIS — M9901 Segmental and somatic dysfunction of cervical region: Secondary | ICD-10-CM | POA: Diagnosis not present

## 2023-12-19 DIAGNOSIS — N764 Abscess of vulva: Secondary | ICD-10-CM | POA: Diagnosis not present

## 2023-12-19 DIAGNOSIS — F411 Generalized anxiety disorder: Secondary | ICD-10-CM | POA: Diagnosis not present

## 2023-12-19 DIAGNOSIS — F321 Major depressive disorder, single episode, moderate: Secondary | ICD-10-CM | POA: Diagnosis not present

## 2023-12-27 DIAGNOSIS — N764 Abscess of vulva: Secondary | ICD-10-CM | POA: Diagnosis not present

## 2024-01-02 DIAGNOSIS — N764 Abscess of vulva: Secondary | ICD-10-CM | POA: Diagnosis not present
